# Patient Record
Sex: Male | Born: 1940 | Race: White | Hispanic: No | Marital: Married | Smoking: Never smoker
Health system: Southern US, Community
[De-identification: ages and names within clinical notes are randomized; demographics above are authoritative.]

## PROBLEM LIST (undated history)

## (undated) DIAGNOSIS — E7800 Pure hypercholesterolemia, unspecified: Secondary | ICD-10-CM

## (undated) DIAGNOSIS — K469 Unspecified abdominal hernia without obstruction or gangrene: Secondary | ICD-10-CM

## (undated) DIAGNOSIS — M1990 Unspecified osteoarthritis, unspecified site: Secondary | ICD-10-CM

## (undated) DIAGNOSIS — K859 Acute pancreatitis without necrosis or infection, unspecified: Secondary | ICD-10-CM

## (undated) DIAGNOSIS — I10 Essential (primary) hypertension: Secondary | ICD-10-CM

## (undated) DIAGNOSIS — Z8719 Personal history of other diseases of the digestive system: Secondary | ICD-10-CM

## (undated) DIAGNOSIS — K5790 Diverticulosis of intestine, part unspecified, without perforation or abscess without bleeding: Secondary | ICD-10-CM

## (undated) HISTORY — DX: Diverticulosis of intestine, part unspecified, without perforation or abscess without bleeding: K57.90

## (undated) HISTORY — PX: ROTATOR CUFF REPAIR: SHX139

---

## 2001-12-03 ENCOUNTER — Other Ambulatory Visit: Admission: RE | Admit: 2001-12-03 | Discharge: 2001-12-03 | Payer: Self-pay | Admitting: Dermatology

## 2003-09-05 ENCOUNTER — Ambulatory Visit (HOSPITAL_COMMUNITY): Admission: RE | Admit: 2003-09-05 | Discharge: 2003-09-05 | Payer: Self-pay | Admitting: Internal Medicine

## 2007-12-28 ENCOUNTER — Observation Stay (HOSPITAL_COMMUNITY): Admission: EM | Admit: 2007-12-28 | Discharge: 2007-12-29 | Payer: Self-pay | Admitting: Emergency Medicine

## 2008-02-14 ENCOUNTER — Ambulatory Visit (HOSPITAL_COMMUNITY): Admission: RE | Admit: 2008-02-14 | Discharge: 2008-02-14 | Payer: Self-pay | Admitting: Sports Medicine

## 2008-03-06 ENCOUNTER — Ambulatory Visit (HOSPITAL_BASED_OUTPATIENT_CLINIC_OR_DEPARTMENT_OTHER): Admission: RE | Admit: 2008-03-06 | Discharge: 2008-03-07 | Payer: Self-pay | Admitting: Orthopedic Surgery

## 2008-03-17 ENCOUNTER — Encounter (HOSPITAL_COMMUNITY): Admission: RE | Admit: 2008-03-17 | Discharge: 2008-04-16 | Payer: Self-pay | Admitting: Orthopedic Surgery

## 2008-04-18 ENCOUNTER — Encounter (HOSPITAL_COMMUNITY): Admission: RE | Admit: 2008-04-18 | Discharge: 2008-05-18 | Payer: Self-pay | Admitting: Orthopedic Surgery

## 2008-05-20 ENCOUNTER — Encounter (HOSPITAL_COMMUNITY): Admission: RE | Admit: 2008-05-20 | Discharge: 2008-06-19 | Payer: Self-pay | Admitting: Orthopedic Surgery

## 2008-07-08 ENCOUNTER — Encounter (HOSPITAL_COMMUNITY): Admission: RE | Admit: 2008-07-08 | Discharge: 2008-08-07 | Payer: Self-pay | Admitting: Orthopedic Surgery

## 2008-08-08 ENCOUNTER — Encounter (HOSPITAL_COMMUNITY): Admission: RE | Admit: 2008-08-08 | Discharge: 2008-08-15 | Payer: Self-pay | Admitting: Orthopedic Surgery

## 2008-12-26 HISTORY — PX: MELANOMA EXCISION: SHX5266

## 2009-02-05 ENCOUNTER — Emergency Department (HOSPITAL_COMMUNITY): Admission: EM | Admit: 2009-02-05 | Discharge: 2009-02-05 | Payer: Self-pay | Admitting: Emergency Medicine

## 2009-11-10 ENCOUNTER — Ambulatory Visit (HOSPITAL_COMMUNITY): Admission: RE | Admit: 2009-11-10 | Discharge: 2009-11-10 | Payer: Self-pay | Admitting: Sports Medicine

## 2009-12-10 ENCOUNTER — Encounter (HOSPITAL_COMMUNITY): Admission: RE | Admit: 2009-12-10 | Discharge: 2009-12-23 | Payer: Self-pay | Admitting: Orthopedic Surgery

## 2009-12-28 ENCOUNTER — Encounter (HOSPITAL_COMMUNITY): Admission: RE | Admit: 2009-12-28 | Discharge: 2010-01-27 | Payer: Self-pay | Admitting: Orthopedic Surgery

## 2010-01-29 ENCOUNTER — Encounter (HOSPITAL_COMMUNITY): Admission: RE | Admit: 2010-01-29 | Discharge: 2010-02-28 | Payer: Self-pay | Admitting: Orthopedic Surgery

## 2010-03-01 ENCOUNTER — Encounter (HOSPITAL_COMMUNITY): Admission: RE | Admit: 2010-03-01 | Discharge: 2010-03-31 | Payer: Self-pay | Admitting: Orthopedic Surgery

## 2011-05-10 NOTE — Op Note (Signed)
NAMEVANDELL, Russell Bryant              ACCOUNT NO.:  [id number]   MEDICAL RECORD NO.:  [id number]          PATIENT TYPE:  AMB   LOCATION:  DSC                          FACILITY:  [facility]   PHYSICIAN:  Loreta Ave, M.D. 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   PROCEDURES:  1. Left shoulder examination under anesthesia.  2. Arthroscopy with manipulation.  3. Debridement of adhesions and rotator cuff.  4. Acromioplasty, coracoacromial ligament release, excision of distal      clavicle.  5. Arthroscopic-assisted rotator cuff repair with fiber weave suture      x2 and swivel lock anchors x2.   SURGEON:  Loreta Ave, M.D.   ASSISTANT:  Zonia Kief, PA, present throughout the entire case and  necessary for timely completion of the procedure.   ANESTHESIA:  General.   BLOOD LOSS:  Minimal.   SPECIMENS:  None.   CULTURES:  None.   COMPLICATIONS:  None.   DRESSING:  Soft compressive with shoulder immobilizer.   PROCEDURE:  The patient brought to the operating room and after adequate  anesthesia had been obtained, left shoulder examined.  Lacking about 25-  30% of motion in all planes, especially going overhead and with  rotation.  Manipulated, breaking up adhesions, achieving full motion,  maintaining a stable shoulder.  Placed in beach-chair position on a  shoulder positioner and prepped and draped in the usual sterile fashion.  Three portals, anterior, posterior and lateral.  Shoulder entered with  blunt obturator.  Arthroscope introduced, shoulder distended and  inspected.  Residual adhesions and synovitis debrided.  Articular  cartilage of  labrum intact.  Partial tearing, long head biceps tendon,  with about the 30% compromise but still attached to the top of the  glenoid and functional and intact.  Complete tear, entire supraspinatus  tendon, from the biceps all way into the top half of the infraspinatus.  Appeared to be relatively acute.  Although a complete tear, still very  mobile and reparable.  Debrided.  Tuberosity roughened to bleeding bone.  Cannula redirected subacromially.  Bursitis adhesions debrided.  Cuff  debrided.  Acromioplasty from a type 3 to a type 1 acromion with shaver  and high-speed bur, releasing CA ligament with cautery.  Distal clavicle  with grade 3 and 4 changes.  Periarticular spurs and the lateral  centimeter of clavicle resected.  Adequacy of decompression and clavicle  excision confirmed viewing from all portals.  Lateral portal opened up  and a cannula inserted.  Cuff mobilized.  It was captured with two  horizontal mattress fiber weave sutures with the suture brought out the  front.  I then brought the front one out through the side, attached to a  swivel lock device.  Anchored down to the tuberosity in a drill hole  with the  swivel lock device with appropriate tension.  Nice capturing  and fixation.  I then repeated the same process with the more posterior  suture, anchoring that a little bit more posteriorly.  Once those were  firmly anchored and the sutures cut, the repair was assessed.  I had a  nice, firm, watertight closure of the entire cuff.  The back stitch  included the top half of the infraspinatus.  I was pleased with  tensioning and repair and not undue tension even with the arm at the  side.  Instruments and fluid removed.  Everything irrigated.  Portals  were all closed with nylon.  Sterile compressive dressing applied.  Shoulder mobilizer applied.  Anesthesia reversed.  Brought to the  recovery room.  Tolerated the surgery well with no complications.      Loreta Ave, M.D.  Electronically Signed     DFM/MEDQ  D:  03/06/2008  T:  03/07/2008  Job:  [id number]

## 2011-05-10 NOTE — H&P (Signed)
NAMEEUAN, Russell Bryant              ACCOUNT NO.:  [id number]   MEDICAL RECORD NO.:  [id number]          PATIENT TYPE:  EMS   LOCATION:  ED                            FACILITY:  [facility]   PHYSICIAN:  Osvaldo Shipper, MD     DATE OF BIRTH:  Oct 21, 1941   DATE OF ADMISSION:  12/28/2007  DATE OF DISCHARGE:  LH                              HISTORY & PHYSICAL   PRIORITY ADMISSION HISTORY AND PHYSICAL   PRIMARY CARE PHYSICIAN:  Kirk Ruths, MD, with [facility].   ADMITTING DIAGNOSES:  1. Syncopal episode unclear etiology.  2. History of hypertension.  3. History of dyslipidemia.   CHIEF COMPLAINT:  Passed out.   HISTORY OF PRESENT ILLNESS:  The patient is a 70 year old, Caucasian  male, who has hypertension and dyslipidemia, who otherwise is very  healthy, who was standing in a line at [employer] waiting to pay  his bill when he felt hot and slightly flushed and then apparently  passed out.  He was unconscious for about a minute.  EMS was called and  he was brought into the ED.  Apparently his blood sugar was checked at  that time and it was 121.  The patient did not experience any chest pain  or shortness of breath.  He was a little bit nauseous but did not have  any vomiting.  No history of fever or chills though he was down with  what he describes as sinus congestion for the past few days and he  finished a course of Amoxicillin yesterday.  Denies any headaches.  He  does now admit to having some pain in the left arm.  He says that  movement of the left arm causes the pain to increase.  He does not know  if he fell on that side or not.   He admits to having one other episode of syncope about a year ago.  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  The  circumstances were exactly the same as this time.  The last time, his  fall was prevented by his wife, who was standing behind him.  He did not  have any further evaluation for  his syncope at that time.   MEDICATIONS AT HOME:  1. Aspirin 81 mg daily.  2. Centrum Silver one tablet daily.  3. Welchol unknown dose.  4. Lotrel 10/20 one tablet daily.   ALLERGIES:  No known drug allergies.   PAST MEDICAL HISTORY:  1. Hypertension.  2. Dyslipidemia.  3. 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  4. Reports having cramps in his legs and his abdomen at times and did      have one episode today after the fall, but he denies any abdominal      pain at this time.  5. He had  a stress test about three years ago done by [facility] and was recorded as being negative.   SOCIAL HISTORY:  He lives in [place] with his wife.  He currently  works part-time in a [employer] center.  He is a retired person and  used to work at [employer].  Denies any smoking, no  alcohol or illicit drug use.  He walks two miles every day.   FAMILY HISTORY:  Father died of an MI many years ago.  Mother died at  the age of 83 of unknown causes.   REVIEW OF SYSTEMS:  GENERAL REVIEW OF SYSTEMS:  Positive for mild  weakness.  HEENT:  Unremarkable.  CARDIOVASCULAR:  Unremarkable.  RESPIRATORY:  Unremarkable.  GI:  No pain at this time.  GU:  Unremarkable.  MUSCULOSKELETAL:  Positive for the left upper pain as  described in the HPI.  NEUROLOGICAL:  Unremarkable.  PSYCHIATRIC:  Unremarkable.  DERMATOLOGICAL:  Positive for skin cancers.  Other  systems unremarkable.   PHYSICAL EXAMINATION:  VITAL SIGNS:  Temperature 98.1, blood pressure  126/76, heart rate 72, respiratory rate 18, saturation 100% on room air.  GENERAL EXAM:  This is a well-developed, well-nourished male in no  distress, slightly anxious.  HEENT:  There is no pallor and no icterus, oral mucous membrane is  moist, no oral lesions are  noted.  NECK:  Soft and supple, no thyromegaly is appreciated.  LUNGS:  Clear to auscultation bilaterally, no wheezing, rales, or  rhonchi.  CARDIOVASCULAR:  S1 and S2 is normal, regular, no murmurs appreciated,  no rubs are heard, there is a bruit heard in the right carotid area,  peripheral pulses are palpable.  ABDOMEN:  Soft, nontender, and nondistended, bowel sounds are present,  no mass or organomegaly is appreciated.  EXTREMITIES:  No edema, peripheral pulses are palpable.  NEUROLOGIC:  The patient is alert and oriented x3, no cranial nerve  deficits, no motor deficit, no sensory deficit, no focal neurological  deficits at this time.   LABORATORY DATA:  CBC is unremarkable.  His BMET shows a sodium of 134,  glucose 132.   EKG was done, which shows a sinus rhythm, a heart rate of 65, a normal  axis, intervals appear to be in the normal range, no definite Q-waves  identified, I do not see any significant ST changes or T-wave changes on  this EKG, essentially an unremarkable EKG.   CT head was done, which was read as not showing any acute abnormality.   ASSESSMENT:  This is a 70 year old Caucasian male, who has a past  medical history of hypertension and dyslipidemia and one prior episode  of syncope, who presents with a syncopal episode.  Most likely vasovagal  though we have to rule out other concerning etiologies.  Arrythmia is a  possibility considering the sudden nature of the syncope.  Acute  coronary syndrome and strokes are a consideration as well.  No chest  pain; hence, pulmonary embolism as a less other possibility.   PLAN:  1. Syncopal episode.  We will check orthostatics, check D-dimer,      cardiac markers, chest x-ray, will monitor him on telemetry, and      rule him out for acute coronary syndrome.  Since tomorrow is      Saturday, an echocardiogram cannot be done.  If he rules out for      acute coronary syndrome and if his telemetry does not  show any       arrhythmias and if he does not have any more syncopal episodes, I      think he can be safely discharged and the echo can be done as an      outpatient.  2. Left arm pain.  It is probably muscular because the pain was      reproduced by palpation.  We will get x-rays to make sure there is      no trauma, and we will give him pain medication as needed.  3. Possible right carotid bruit.  We will obtain a carotid Doppler,      which hopefully can be done tomorrow.  If not, that can be done as      an outpatient as well.   Continue his Lotrel.  We will hold off on the [facility] since we do not  know the exact dose.  We will monitor him closely.   Further management decision will depend on results of initial testing  and patient's response to treatment.      Osvaldo Shipper, MD  Electronically Signed     GK/MEDQ  D:  12/28/2007  T:  12/28/2007  Job:  [id number]   cc:   Kirk Ruths, M.D.  Fax: [phone]

## 2011-05-10 NOTE — Discharge Summary (Signed)
Russell Bryant, Russell Bryant              ACCOUNT NO.:  [id number]   MEDICAL RECORD NO.:  [id number]          PATIENT TYPE:  OBV   LOCATION:  A209                          FACILITY:  [facility]   PHYSICIAN:  Osvaldo Shipper, MD     DATE OF BIRTH:  September 20, 1941   DATE OF ADMISSION:  12/28/2007  DATE OF DISCHARGE:  01/03/2009LH                               DISCHARGE SUMMARY   PRIMARY MEDICAL DOCTOR:  Dr. Yetta Numbers.   DISCHARGE DIAGNOSES:  1. Syncopal episode, likely vasovagal, need to rule out other      etiology.  2. History of hypertension and dyslipidemia.   HISTORY OF PRESENT ILLNESS:  Please see H&P dictated yesterday for  details regarding the patient's presenting illness.   BRIEF HOSPITAL COURSE:  Briefly, this is a 70 year old Caucasian male  who has hypertension and dyslipidemia, but who otherwise is fairly  healthy and very active, who was standing in the line at [employer] waiting to pay his bill when he felt hot and flushed and passed  out.  The episode lasted less than a minute and he woke up.  He did not  have any focal deficits.  No seizure-type activity was noticed.  The  patient was observed in the hospital overnight.  Apparently, he had one  other episode associated with nausea yesterday while he was waiting for  his shoulder x-ray in Radiology.  He had his dinner subsequently and  then felt much better.  No further episodes overnight.  This morning, he  is feeling completely normal.  He still has some pain in the left upper  arm area, but he is able to move it and he says he can tolerate the  pain.  His telemetry was monitored and it showed sinus rhythm with no  tachyarrhythmias.  No bradycardia was noted.  His EKG was repeated and  there were no significant abnormalities.  The patient's CAT scan was  negative.  His shoulder and humerus films were unremarkable.  I do not  suspect any humeral fracture at this time.  He has ruled out for acute  coronary syndrome.  UA  was negative.  His chest x-ray was read as being  normal as well.  He did have a right carotid bruit for which a carotid  Doppler is pending.  He will also need to have an echocardiogram and  preferably a Holter monitor, which will be set up as an outpatient.  Orthostatics will be checked this morning and once the Doppler is done,  he can go home.  I have spoken to Dr. Regino Schultze, who has requested me to  ask the patient to call his office on Monday to schedule an appointment.  He will be asked not to drive for 3 weeks until all the workup is  completed for his episode.   DISCHARGE MEDICATIONS:  At this point, he may continue taking all of his  home medications including:  1. Welchol, unknown dose.  2. Lotrel 10/20 once daily.  3. Aspirin 81 mg daily.  4. Centrum Silver one tablet a day.  5. He has been  asked to take over-the-counter Tylenol for pain      associated with his left upper arm.   FOLLOWUP:  1. Follow up with Dr. Regino Schultze early next week.  2. He will be given prescriptions for an echocardiogram and Holter      monitor which he can take to [facility] office and set      up an appointment for these 2.  Carotid Dopplers hopefully will be      done today.  I do not expect the result will change his      disposition, but if it does, we will dictate an addendum to this      discharge summary.   All of his questions and his wife's questions were answered to their  satisfaction and I have once again emphasized the importance of followup  with his PMD.   COMMENT:  Please note above is preliminary until signed.   DISCHARGE PROCESS:  Total time at discharge was less than 30 minutes.      Osvaldo Shipper, MD  Electronically Signed     GK/MEDQ  D:  12/29/2007  T:  12/29/2007  Job:  [id number]   cc:   Kirk Ruths, M.D.  Fax: [phone]

## 2011-08-03 ENCOUNTER — Emergency Department (HOSPITAL_COMMUNITY): Payer: Medicare Other

## 2011-08-03 ENCOUNTER — Emergency Department (HOSPITAL_COMMUNITY)
Admission: EM | Admit: 2011-08-03 | Discharge: 2011-08-03 | Disposition: A | Discharge status: Home / Self Care | Payer: Medicare Other | Attending: Emergency Medicine | Admitting: Emergency Medicine

## 2011-08-03 ENCOUNTER — Other Ambulatory Visit: Payer: Self-pay

## 2011-08-03 DIAGNOSIS — R109 Unspecified abdominal pain: Secondary | ICD-10-CM

## 2011-08-03 DIAGNOSIS — R1013 Epigastric pain: Secondary | ICD-10-CM | POA: Insufficient documentation

## 2011-08-03 HISTORY — DX: Acute pancreatitis without necrosis or infection, unspecified: K85.90

## 2011-08-03 HISTORY — DX: Unspecified abdominal hernia without obstruction or gangrene: K46.9

## 2011-08-03 LAB — CARDIAC PANEL(CRET KIN+CKTOT+MB+TROPI)
CK, MB: 4.1 ng/mL — ABNORMAL HIGH (ref 0.3–4.0)
CK, MB: 4 ng/mL (ref 0.3–4.0)
Relative Index: 2.1 (ref 0.0–2.5)
Relative Index: 2.2 (ref 0.0–2.5)
Total CK: 193 U/L (ref 7–232)
Total CK: 183 U/L (ref 7–232)
Troponin I: <0.3 ng/mL (ref <0.30)
Troponin I: <0.3 ng/mL (ref <0.30)

## 2011-08-03 LAB — DIFFERENTIAL
Basophils Relative: 0 % (ref 0–1)
Lymphs Abs: 2.5 10*3/uL (ref 0.7–4.0)
Monocytes Relative: 9 % (ref 3–12)
Neutro Abs: 3.2 10*3/uL (ref 1.7–7.7)
Neutrophils Relative %: 49 % (ref 43–77)

## 2011-08-03 LAB — COMPREHENSIVE METABOLIC PANEL
Albumin: 3.9 g/dL (ref 3.5–5.2)
Alkaline Phosphatase: 58 U/L (ref 39–117)
BUN: 13 mg/dL (ref 6–23)
Calcium: 9.3 mg/dL (ref 8.4–10.5)
GFR calc Af Amer: >60 mL/min (ref >60)
Glucose, Bld: 110 mg/dL — ABNORMAL HIGH (ref 70–99)
Potassium: 4 mEq/L (ref 3.5–5.1)
Sodium: 139 mEq/L (ref 135–145)
Total Protein: 7.6 g/dL (ref 6.0–8.3)

## 2011-08-03 LAB — URINALYSIS, ROUTINE W REFLEX MICROSCOPIC
Bilirubin Urine: NEGATIVE
Glucose, UA: NEGATIVE mg/dL
Ketones, ur: NEGATIVE mg/dL
Leukocytes, UA: NEGATIVE
Nitrite: NEGATIVE
Protein, ur: NEGATIVE mg/dL
Specific Gravity, Urine: >1.03 — ABNORMAL HIGH (ref 1.005–1.030)
Urobilinogen, UA: 0.2 mg/dL (ref 0.0–1.0)
pH: 5.5 (ref 5.0–8.0)

## 2011-08-03 LAB — URINE MICROSCOPIC-ADD ON

## 2011-08-03 LAB — CBC
Hemoglobin: 14.9 g/dL (ref 13.0–17.0)
Platelets: 223 10*3/uL (ref 150–400)
RBC: 4.7 MIL/uL (ref 4.22–5.81)

## 2011-08-03 LAB — LIPASE, BLOOD: Lipase: 54 U/L (ref 11–59)

## 2011-08-03 MED ORDER — FENTANYL CITRATE 0.05 MG/ML IJ SOLN
50.0000 ug | Freq: Once | INTRAMUSCULAR | Status: DC
  Filled 2011-08-03: qty 2

## 2011-08-03 MED ORDER — FAMOTIDINE 20 MG PO TABS
10.0000 mg | ORAL_TABLET | Freq: Once | ORAL | Status: AC
  Administered 2011-08-03: 20 mg via ORAL
  Filled 2011-08-03: qty 1

## 2011-08-03 NOTE — ED Notes (Signed)
Pt indicates epigastric pain.

## 2011-08-03 NOTE — Discharge Instructions (Signed)
Abdominal Pain Abdominal pain can be caused by many things. Your caregiver decides the seriousness of your pain by an examination and possibly blood tests and X-rays. Many cases can be observed and treated at home. Most abdominal pain is not caused by a disease and will probably improve without treatment. However, in many cases, more time must pass before a clear cause of the pain can be found. Before that point, it may not be known if you need more testing, or if hospitalization or surgery is needed. HOME CARE INSTRUCTIONS  Do not take laxatives unless directed by your caregiver.   Take pain medicine only as directed by your caregiver.   Only take over-the-counter or prescription medicines for pain, discomfort, or fever as directed by your caregiver.   Try a clear liquid diet (broth, tea, or water)  or as ordered by your caregiver. Slowly move to a bland diet as tolerated.  SEEK IMMEDIATE MEDICAL CARE IF:  The pain does not go away.   You or your child has an oral temperature above 101, not controlled by medicine.   You keep throwing up (vomiting).   The pain is felt only in portions of the abdomen. Pain in the right side could possibly be appendicitis. In an adult, pain in the left lower portion of the abdomen could be colitis or diverticulitis.   You pass bloody or black tarry stools.  MAKE SURE YOU:  Understand these instructions.   Will watch your condition.   Will get help right away if you are not doing well or get worse.  Document Released: 09/21/2005 Document Re-Released: 03/08/2010 [facility] Patient Information 2011 [place], [place].

## 2011-08-03 NOTE — ED Provider Notes (Addendum)
Scribed for Dr. Hillery Hunter, the patient was seen in room 10. This chart was scribed by [employer]. This patient's care was started at 5:22PM.    History     CSN: [id number] Arrival date & time: 08/03/2011  5:05 PM  Chief Complaint  Patient presents with  . Abdominal Pain   Patient is a 70 y.o. male presenting with abdominal pain. The history is provided by the patient.  Abdominal Pain The primary symptoms of the illness include abdominal pain. The primary symptoms of the illness do not include shortness of breath, nausea, vomiting, diarrhea or dysuria.  Symptoms associated with the illness do not include chills, diaphoresis, constipation, hematuria or back pain.   Patient presents to the ED with complaint of dull and constant abdominal pain in the past hour. Onset occuring while sitting. He reports that pain is different from that of pancreatis formerly experienced. Patient localizes pain an inch below the ribcage, proximal to the umbilicus, and reports that it does not radiate to the chest. He states that he had eaten a candy bar before the pain started. Pain was alleviated by Prolisec and "a couple of tums". He ranks pain upon onset at 8/10 but places it at 4/10 currently. He denies nausea, vomiting, diarrhea, sweating, dysuria, blood in stool, constipation, shortness of breath, bruising or bleeding, light headedness, dizziness, fever or chills. Patient reports last normal bowel movement was this morning. Patient denies history of abdominal surgery.    Past Medical History  Diagnosis Date  . Pancreatitis   . Hernia     History reviewed. No pertinent past surgical history.  History reviewed. No pertinent family history.  History  Substance Use Topics  . Smoking status: Never Smoker   . Smokeless tobacco: Not on file  . Alcohol Use: No      Review of Systems  Constitutional: Negative for chills and diaphoresis.  HENT: Negative for neck pain.   Eyes: Negative for  photophobia and visual disturbance.  Respiratory: Negative for cough and shortness of breath.   Cardiovascular: Negative for chest pain.  Gastrointestinal: Positive for abdominal pain. Negative for nausea, vomiting, diarrhea, constipation and blood in stool.  Genitourinary: Negative for dysuria and hematuria.  Musculoskeletal: Negative for back pain.  Neurological: Negative for dizziness, syncope, weakness, light-headedness, numbness and headaches.  Psychiatric/Behavioral: Negative for behavioral problems, confusion and agitation.  All other systems reviewed and are negative.    Physical Exam  BP 130/71  Pulse 66  Temp(Src) 97.6 F (36.4 C) (Oral)  Resp 20  Ht 5\' 8"  (1.727 m)  Wt 170 lb (77.111 kg)  BMI 25.85 kg/m2  SpO2 96%  Physical Exam  Nursing note and vitals reviewed. Constitutional: He is oriented to person, place, and time. He appears well-nourished. No distress.  HENT:  Head: Atraumatic.  Eyes: Conjunctivae and EOM are normal. Pupils are equal, round, and reactive to light.  Neck: Normal range of motion. Neck supple. No JVD present. No tracheal tenderness present. Carotid bruit is not present. No thyromegaly present.  Cardiovascular: Normal rate, regular rhythm, S1 normal, S2 normal and normal heart sounds.   Pulmonary/Chest: Effort normal. No stridor. No respiratory distress. He has no wheezes.       CTAB  Abdominal: Soft. Bowel sounds are normal. He exhibits no distension. There is no tenderness. There is no rebound and no guarding.       Tenderness just above umbilicus  Musculoskeletal: Normal range of motion.  Neurological: He is alert and oriented to person,  place, and time. He displays normal reflexes. GCS eye subscore is 4. GCS verbal subscore is 5. GCS motor subscore is 6.       5/5 strength in all extremities  Skin: Skin is warm and dry.  Psychiatric: He has a normal mood and affect. His behavior is normal.    ED Course  Procedures  OTHER DATA  REVIEWED: Nursing notes, vital signs, and past medical records reviewed. All labs/vitals reviewed and considered Imaging results reviewed and considered  DIAGNOSTIC STUDIES: Oxygen Saturation is 98% on room air, normal by my interpretation.     Date: 08/03/2011  Rate: 73  Rhythm: normal sinus rhythm  QRS Axis: normal  Intervals: normal  ST/T Wave abnormalities: normal  Conduction Disutrbances:none  Narrative Interpretation: Possible left atrial enlargement     LABS / RADIOLOGY: Results for orders placed during the hospital encounter of 08/03/11  CBC      Component Value Range   WBC 6.5  4.0 - 10.5 (K/uL)   RBC 4.70  4.22 - 5.81 (MIL/uL)   Hemoglobin 14.9  13.0 - 17.0 (g/dL)   HCT 16.1  09.6 - 04.5 (%)   MCV 90.4  78.0 - 100.0 (fL)   MCH 31.7  26.0 - 34.0 (pg)   MCHC 35.1  30.0 - 36.0 (g/dL)   RDW 40.9  81.1 - 91.4 (%)   Platelets 223  150 - 400 (K/uL)  DIFFERENTIAL      Component Value Range   Neutrophils Relative 49  43 - 77 (%)   Neutro Abs 3.2  1.7 - 7.7 (K/uL)   Lymphocytes Relative 38  12 - 46 (%)   Lymphs Abs 2.5  0.7 - 4.0 (K/uL)   Monocytes Relative 9  3 - 12 (%)   Monocytes Absolute 0.6  0.1 - 1.0 (K/uL)   Eosinophils Relative 3  0 - 5 (%)   Eosinophils Absolute 0.2  0.0 - 0.7 (K/uL)   Basophils Relative 0  0 - 1 (%)   Basophils Absolute 0.0  0.0 - 0.1 (K/uL)  CARDIAC PANEL(CRET KIN+CKTOT+MB+TROPI)      Component Value Range   Total CK 193  7 - 232 (U/L)   CK, MB 4.1 (*) 0.3 - 4.0 (ng/mL)   Troponin I <0.30  <0.30 (ng/mL)   Relative Index 2.1  0.0 - 2.5   COMPREHENSIVE METABOLIC PANEL      Component Value Range   Sodium 139  135 - 145 (mEq/L)   Potassium 4.0  3.5 - 5.1 (mEq/L)   Chloride 103  96 - 112 (mEq/L)   CO2 23  19 - 32 (mEq/L)   Glucose, Bld 110 (*) 70 - 99 (mg/dL)   BUN 13  6 - 23 (mg/dL)   Creatinine, Ser 7.82  0.50 - 1.35 (mg/dL)   Calcium 9.3  8.4 - 95.6 (mg/dL)   Total Protein 7.6  6.0 - 8.3 (g/dL)   Albumin 3.9  3.5 - 5.2 (g/dL)    AST 62 (*) 0 - 37 (U/L)   ALT 43  0 - 53 (U/L)   Alkaline Phosphatase 58  39 - 117 (U/L)   Total Bilirubin 1.2  0.3 - 1.2 (mg/dL)   GFR calc non Af Amer >60  >60 (mL/min)   GFR calc Af Amer >60  >60 (mL/min)  LIPASE, BLOOD      Component Value Range   Lipase 54  11 - 59 (U/L)  URINALYSIS, ROUTINE W REFLEX MICROSCOPIC      Component Value  Range   Color, Urine YELLOW  YELLOW    Appearance CLEAR  CLEAR    Specific Gravity, Urine >1.030 (*) 1.005 - 1.030    pH 5.5  5.0 - 8.0    Glucose, UA NEGATIVE  NEGATIVE (mg/dL)   Hgb urine dipstick TRACE (*) NEGATIVE    Bilirubin Urine NEGATIVE  NEGATIVE    Ketones, ur NEGATIVE  NEGATIVE (mg/dL)   Protein, ur NEGATIVE  NEGATIVE (mg/dL)   Urobilinogen, UA 0.2  0.0 - 1.0 (mg/dL)   Nitrite NEGATIVE  NEGATIVE    Leukocytes, UA NEGATIVE  NEGATIVE   URINE MICROSCOPIC-ADD ON      Component Value Range   Squamous Epithelial / LPF RARE  RARE    WBC, UA 0-2  <3 (WBC/hpf)   RBC / HPF 0-2  <3 (RBC/hpf)   Bacteria, UA RARE  RARE    Urine-Other MUCOUS PRESENT     Dg Abd Acute W/chest  08/03/2011  *RADIOLOGY REPORT*  Clinical Data: Epigastric pain, hiatal hernia, pancreatitis  ACUTE ABDOMEN SERIES (ABDOMEN 2 VIEW & CHEST 1 VIEW)  Comparison: None.  Findings: Lungs are clear. No pleural effusion or pneumothorax.  Cardiomediastinal silhouette is within normal limits.  Nonobstructive bowel gas pattern.  No evidence of free air under the diaphragm on the upright view.  Degenerative changes of the visualized thoracolumbar spine.  IMPRESSION: No evidence of acute cardiopulmonary disease.  No evidence of small bowel obstruction or free air.  Original Report Authenticated By: Charline Bills, M.D.    ED COURSE / COORDINATION OF CARE: Orders Placed This Encounter  Procedures  . Urine culture  . DG Abd Acute W/Chest  . CBC  . Differential  . Cardiac panel(cret kin+cktot+mb+tropi)  . Comprehensive metabolic panel  . Lipase, blood  . Urinalysis, Routine w reflex  microscopic  . Urine microscopic-add on  . Cardiac panel Timed (cret kin+cktot+mb+tropi)   19:47 [place] Fritze K Keiran Gaffey-Rasch, MD rechecked with patient and discussed returned lab work and results. Patient condition is improved and pain is currently 0/10 on the pain scale. Possible discharge protocol discussed.    IMPRESSION: Diagnoses that have been ruled out:  Diagnoses that are still under consideration:  Final diagnoses:      PLAN:  Discharge The patient is to return the emergency department if there is any worsening of symptoms. I have reviewed the discharge instructions with the patient and family  CONDITION ON DISCHARGE:good   MEDICATIONS GIVEN IN THE E.D.  Medications  amLODipine-benazepril (LOTREL) 10-20 MG per capsule (not administered)  aspirin EC 81 MG tablet (not administered)  Multiple Vitamins-Minerals (CENTRUM SILVER ULTRA MENS PO) (not administered)  colesevelam (WELCHOL) 625 MG tablet (not administered)  simvastatin (ZOCOR) 10 MG tablet (not administered)  PRESCRIPTION MEDICATION (not administered)  fentaNYL (SUBLIMAZE) injection 50 mcg (50 mcg Intravenous Not Given 08/03/11 1837)     DISCHARGE MEDICATIONS: New Prescriptions   No medications on file      Return for return of pain, chest pain,SOB, DOE,  nausea vomiting or pain in the abdomen that localizes to the RLQ or any concerns.  Follow up with family doctor for recheck tomorrow. Patient verbalizes understanding and agrees to follow up   I personally performed the services described in this documentation, which was scribed in my presence. The recorded information has been reviewed and considered. Colt Martelle K Geneva Pallas-Rasch, MD   [place] Urieta K Martita Brumm-Rasch, MD 08/03/11 2100  Jasmine Awe, MD 08/03/11 2108

## 2011-08-03 NOTE — ED Notes (Signed)
Pt self ambulated out with a steady gait stating no needs 

## 2011-08-03 NOTE — ED Notes (Signed)
Pt refused fentanyl pain med stating, " The pain is gone and I don't think I need it."  nad noted.

## 2011-08-05 LAB — URINE CULTURE: Culture  Setup Time: 201208090128

## 2011-09-15 LAB — URINALYSIS, ROUTINE W REFLEX MICROSCOPIC
Bilirubin Urine: NEGATIVE
Glucose, UA: 100 — AB
Hgb urine dipstick: NEGATIVE
Ketones, ur: NEGATIVE
Protein, ur: NEGATIVE

## 2011-09-15 LAB — DIFFERENTIAL
Basophils Relative: 1
Eosinophils Absolute: 0.1
Monocytes Relative: 7
Neutrophils Relative %: 57

## 2011-09-15 LAB — LIPID PANEL
Cholesterol: 204 — ABNORMAL HIGH
LDL Cholesterol: 140 — ABNORMAL HIGH
Triglycerides: 110

## 2011-09-15 LAB — BASIC METABOLIC PANEL
CO2: 27
Calcium: 9.2
Chloride: 100
Creatinine, Ser: 1.15
GFR calc Af Amer: >60
Glucose, Bld: 132 — ABNORMAL HIGH

## 2011-09-15 LAB — CBC
MCHC: 34.7
MCV: 88.6
Platelets: 282

## 2011-09-15 LAB — CARDIAC PANEL(CRET KIN+CKTOT+MB+TROPI)
Relative Index: 1.3
Total CK: 157
Troponin I: 0.02

## 2011-09-15 LAB — POCT CARDIAC MARKERS
Myoglobin, poc: 119
Myoglobin, poc: 155
Troponin i, poc: <0.05

## 2011-09-15 LAB — TSH: TSH: 3.375

## 2011-09-19 LAB — BASIC METABOLIC PANEL
BUN: 12
CO2: 28
Chloride: 102
Creatinine, Ser: 0.99
Glucose, Bld: 81
Potassium: 4.8

## 2012-02-11 ENCOUNTER — Emergency Department (HOSPITAL_COMMUNITY)
Admission: EM | Admit: 2012-02-11 | Discharge: 2012-02-12 | Disposition: A | Discharge status: Home / Self Care | Payer: Medicare Other | Attending: Emergency Medicine | Admitting: Emergency Medicine

## 2012-02-11 ENCOUNTER — Encounter (HOSPITAL_COMMUNITY): Payer: Self-pay | Admitting: *Deleted

## 2012-02-11 ENCOUNTER — Emergency Department (HOSPITAL_COMMUNITY): Payer: Medicare Other

## 2012-02-11 ENCOUNTER — Other Ambulatory Visit: Payer: Self-pay

## 2012-02-11 DIAGNOSIS — R110 Nausea: Secondary | ICD-10-CM | POA: Insufficient documentation

## 2012-02-11 DIAGNOSIS — R1013 Epigastric pain: Secondary | ICD-10-CM

## 2012-02-11 DIAGNOSIS — R10816 Epigastric abdominal tenderness: Secondary | ICD-10-CM | POA: Insufficient documentation

## 2012-02-11 LAB — DIFFERENTIAL
Basophils Absolute: 0 10*3/uL (ref 0.0–0.1)
Basophils Relative: 0 % (ref 0–1)
Eosinophils Relative: 2 % (ref 0–5)
Monocytes Absolute: 0.8 10*3/uL (ref 0.1–1.0)
Monocytes Relative: 10 % (ref 3–12)

## 2012-02-11 LAB — LIPASE, BLOOD: Lipase: 59 U/L (ref 11–59)

## 2012-02-11 LAB — COMPREHENSIVE METABOLIC PANEL
AST: 55 U/L — ABNORMAL HIGH (ref 0–37)
Albumin: 4.2 g/dL (ref 3.5–5.2)
BUN: 18 mg/dL (ref 6–23)
CO2: 29 mEq/L (ref 19–32)
Calcium: 9.5 mg/dL (ref 8.4–10.5)
Chloride: 100 mEq/L (ref 96–112)
Creatinine, Ser: 1.08 mg/dL (ref 0.50–1.35)
GFR calc non Af Amer: 67 mL/min — ABNORMAL LOW (ref >90)
Total Bilirubin: 0.9 mg/dL (ref 0.3–1.2)

## 2012-02-11 LAB — CBC
HCT: 43.2 % (ref 39.0–52.0)
Hemoglobin: 14.9 g/dL (ref 13.0–17.0)
MCHC: 34.5 g/dL (ref 30.0–36.0)
MCV: 91.5 fL (ref 78.0–100.0)
RDW: 12.7 % (ref 11.5–15.5)

## 2012-02-11 MED ORDER — FAMOTIDINE 20 MG PO TABS
20.0000 mg | ORAL_TABLET | Freq: Once | ORAL | Status: AC
  Administered 2012-02-11: 20 mg via ORAL
  Filled 2012-02-11: qty 1

## 2012-02-11 MED ORDER — MORPHINE SULFATE 4 MG/ML IJ SOLN
4.0000 mg | Freq: Once | INTRAMUSCULAR | Status: AC
  Administered 2012-02-11: 4 mg via INTRAVENOUS
  Filled 2012-02-11: qty 1

## 2012-02-11 MED ORDER — ONDANSETRON HCL 4 MG/2ML IJ SOLN
4.0000 mg | Freq: Once | INTRAMUSCULAR | Status: AC
  Administered 2012-02-11: 4 mg via INTRAVENOUS
  Filled 2012-02-11: qty 2

## 2012-02-11 NOTE — ED Notes (Signed)
Pt c/o upper abdominal pain and heaving x 1 hr.

## 2012-02-11 NOTE — ED Notes (Signed)
Medicated as ordered for 9\10 pain. Bed in low position and locked with side rails up. Call bell within reach. Family at bedside.

## 2012-02-11 NOTE — ED Provider Notes (Signed)
Pt seen with PA Idol He is here with abd pain, it is now resolved He denies cp/sob His abd is soft without focal tenderness Pt is requesting d/c home BP 148/72  Pulse 76  Temp 97.2 F (36.2 C)  Resp 20  Ht 5\' 8"  (1.727 m)  Wt 172 lb (78.019 kg)  BMI 26.15 kg/m2  SpO2 100%  Date: 02/11/2012  Rate: 80  Rhythm: normal sinus rhythm  QRS Axis: normal  Intervals: normal  ST/T Wave abnormalities: normal  Conduction Disutrbances:none    Joya Gaskins, MD 02/11/12 2358

## 2012-02-11 NOTE — ED Notes (Signed)
Into room to assess patient. States she started having middle upper abdominal pain in between right and left upper quadrant x 1 hour ago. States is just feels like a lot of pressure\ "gas bubble". Nothing makes the pain better or worse. Last food\ fluid intake was at 1700 this evening and tolerated well. Last bowel movement was this morning and was normal. Denies any vomiting or diarrhea. States he had one episode of heaving prior to arrival but did not vomit anything up. Active bowel sounds in all fields. Tender below epigastrium. Denies any radiation of pain. Getting into gown at this time. Denies needs. Call bell within reach. Awaiting md eval.

## 2012-02-11 NOTE — ED Notes (Signed)
Patient states he can not pass urine for specimen at this time.

## 2012-02-11 NOTE — ED Notes (Signed)
Normal saline started to push medications. Dialed back to [facility] after administration.

## 2012-02-11 NOTE — ED Notes (Signed)
Report given to Gail, RN

## 2012-02-11 NOTE — ED Notes (Signed)
Burgess Amor, PA at bedside to evaluate. Into room to attempt iv access.

## 2012-02-12 LAB — URINALYSIS, ROUTINE W REFLEX MICROSCOPIC
Glucose, UA: NEGATIVE mg/dL
Ketones, ur: NEGATIVE mg/dL
Leukocytes, UA: NEGATIVE
Nitrite: NEGATIVE
Urobilinogen, UA: 0.2 mg/dL (ref 0.0–1.0)

## 2012-02-12 LAB — URINE MICROSCOPIC-ADD ON

## 2012-02-12 NOTE — ED Provider Notes (Signed)
History     CSN: [id number]  Arrival date & time 02/11/12  2156   First MD Initiated Contact with Patient 02/11/12 2221      No chief complaint on file.   (Consider location/radiation/quality/duration/timing/severity/associated sxs/prior treatment) Patient is a 71 y.o. male presenting with abdominal pain. The history is provided by the patient.  Abdominal Pain The primary symptoms of the illness include abdominal pain and nausea. The primary symptoms of the illness do not include fever, shortness of breath or vomiting. The current episode started 1 to 2 hours ago. The onset of the illness was gradual.  Risk factors for an acute abdominal problem include being elderly. Symptoms associated with the illness do not include chills, anorexia, diaphoresis, constipation, hematuria or back pain. Associated symptoms comments: Nausea without emesis.  Reports trying to make himself vomit, in the hopes symptoms would improve, but was unable.. Significant associated medical issues do not include PUD, GERD, inflammatory bowel disease, liver disease or cardiac disease. Associated medical issues comments: Reports a history of pancreatitis several years ago.  He was seen bya GI specialist, who speculated he may have passed a gallstone as the trigger for this episode  as no other source could be found,  no symptoms since that time.  He does not drink alcohol..    Past Medical History  Diagnosis Date  . Pancreatitis   . Hernia     History reviewed. No pertinent past surgical history.  History reviewed. No pertinent family history.  History  Substance Use Topics  . Smoking status: Never Smoker   . Smokeless tobacco: Not on file  . Alcohol Use: No      Review of Systems  Constitutional: Negative for fever, chills and diaphoresis.  HENT: Negative for congestion, sore throat and neck pain.   Eyes: Negative.   Respiratory: Negative for chest tightness and shortness of breath.   Cardiovascular:  Negative for chest pain.  Gastrointestinal: Positive for nausea and abdominal pain. Negative for vomiting, constipation and anorexia.  Genitourinary: Negative.  Negative for hematuria.  Musculoskeletal: Negative for back pain, joint swelling and arthralgias.  Skin: Negative.  Negative for rash and wound.  Neurological: Negative for dizziness, weakness, light-headedness, numbness and headaches.  Hematological: Negative.   Psychiatric/Behavioral: Negative.     Allergies  Review of patient's allergies indicates no known allergies.  Home Medications   Current Outpatient Rx  Name Route Sig Dispense Refill  . AMLODIPINE BESY-BENAZEPRIL HCL 10-20 MG PO CAPS Oral Take 1 capsule by mouth daily.      . ASPIRIN EC 81 MG PO TBEC Oral Take 81 mg by mouth daily.      . CENTRUM SILVER ULTRA MENS PO Oral Take 1 tablet by mouth daily.      Marland Kitchen PRESCRIPTION MEDICATION Oral Take 1 tablet by mouth daily. PROSTATE MEDICATION     . SIMVASTATIN 10 MG PO TABS Oral Take 10 mg by mouth at bedtime.        BP 148/72  Pulse 76  Temp 97.2 F (36.2 C)  Resp 20  Ht 5\' 8"  (1.727 m)  Wt 172 lb (78.019 kg)  BMI 26.15 kg/m2  SpO2 100%  Physical Exam  Nursing note and vitals reviewed. Constitutional: He is oriented to person, place, and time. He appears well-developed and well-nourished.  HENT:  Head: Normocephalic and atraumatic.  Eyes: Conjunctivae are normal.  Neck: Normal range of motion.  Cardiovascular: Normal rate, regular rhythm, normal heart sounds and intact distal pulses.   Pulmonary/Chest:  Effort normal and breath sounds normal. He has no wheezes.  Abdominal: Soft. Bowel sounds are normal. He exhibits no distension, no fluid wave, no abdominal bruit and no pulsatile midline mass. There is no hepatosplenomegaly. There is tenderness in the epigastric area. There is no rebound, no guarding and no CVA tenderness.  Musculoskeletal: Normal range of motion.  Neurological: He is alert and oriented to  person, place, and time.  Skin: Skin is warm and dry.  Psychiatric: He has a normal mood and affect.    ED Course  Procedures (including critical care time)  Labs Reviewed  DIFFERENTIAL - Abnormal; Notable for the following:    Neutrophils Relative 41 (*)    Lymphocytes Relative 47 (*)    All other components within normal limits  COMPREHENSIVE METABOLIC PANEL - Abnormal; Notable for the following:    Potassium 3.4 (*)    Glucose, Bld 132 (*)    AST 55 (*)    GFR calc non Af Amer 67 (*)    GFR calc Af Amer 77 (*)    All other components within normal limits  URINALYSIS, ROUTINE W REFLEX MICROSCOPIC - Abnormal; Notable for the following:    Hgb urine dipstick TRACE (*)    Protein, ur TRACE (*)    All other components within normal limits  CBC  LIPASE, BLOOD  URINE MICROSCOPIC-ADD ON   Dg Abd Acute W/chest  02/11/2012  *RADIOLOGY REPORT*  Clinical Data: Epigastric pain.  ACUTE ABDOMEN SERIES (ABDOMEN 2 VIEW & CHEST 1 VIEW)  Comparison: 08/03/2011  Findings: Stable linear densities at the left lung base are suggestive for scarring.  Otherwise, the lungs are clear. Heart and mediastinum are within normal limits.  No evidence for free air. There is a scoliosis of the lumbar spine with degenerative changes. Nonspecific bowel gas pattern.  There is gas and stool in the colon.  Air-fluid level in the stomach.  IMPRESSION: No acute chest findings.  Nonobstructive bowel gas pattern.  Lumbar scoliosis.  Original Report Authenticated By: Richarda Overlie, M.D.     1. Abdominal pain, epigastric       MDM  Patient's symptoms resolved, while in the ED.  He also tolerated a by mouth fluid challenge without return of pain, nausea, or vomiting.  Patient seen by Dr. Bebe Shaggy, and felt he could be discharged safely home.  Strict instructions given for symptoms that should prompt return or followup with PCP.      Candis Musa, PA 02/12/12 0104

## 2012-02-12 NOTE — ED Provider Notes (Signed)
Medical screening examination/treatment/procedure(s) were conducted as a shared visit with non-physician practitioner(s) and myself.  I personally evaluated the patient during the encounter   Joya Gaskins, MD 02/12/12 [phone]

## 2012-02-12 NOTE — Discharge Instructions (Signed)
Abdominal Pain Abdominal pain can be caused by many things. Your caregiver decides the seriousness of your pain by an examination and possibly blood tests and X-rays. Many cases can be observed and treated at home. Most abdominal pain is not caused by a disease and will probably improve without treatment. However, in many cases, more time must pass before a clear cause of the pain can be found. Before that point, it may not be known if you need more testing, or if hospitalization or surgery is needed. HOME CARE INSTRUCTIONS   Do not take laxatives unless directed by your caregiver.   Take pain medicine only as directed by your caregiver.   Only take over-the-counter or prescription medicines for pain, discomfort, or fever as directed by your caregiver.   Try a clear liquid diet (broth, tea, or water) for as long as directed by your caregiver. Slowly move to a bland diet as tolerated.  SEEK IMMEDIATE MEDICAL CARE IF:   The pain does not go away.   You have a fever.   You keep throwing up (vomiting).   The pain is felt only in portions of the abdomen. Pain in the right side could possibly be appendicitis. In an adult, pain in the left lower portion of the abdomen could be colitis or diverticulitis.   You pass bloody or black tarry stools.  MAKE SURE YOU:   Understand these instructions.   Will watch your condition.   Will get help right away if you are not doing well or get worse.  Document Released: 09/21/2005 Document Revised: 08/24/2011 Document Reviewed: 07/30/2008 [facility] Patient Information 2012 [place], [place].   Return here or call your physician if your abdominal pain, returns, or you develop vomiting, or fevers.

## 2012-02-12 NOTE — ED Notes (Signed)
Pt stable at discharge questions answered

## 2012-10-29 ENCOUNTER — Encounter (HOSPITAL_COMMUNITY): Payer: Self-pay | Admitting: Pharmacy Technician

## 2012-10-31 NOTE — Patient Instructions (Signed)
Your procedure is scheduled on:  11/05/12  Report to [facility] at 12:30 PM.  Call this number if you have problems the morning of surgery: [phone]   Remember:   Do not eat or drink:After Midnight.  Take these medicines the morning of surgery with A SIP OF WATER: Amlodipine and Benazepril   Do not wear jewelry, make-up or nail polish.  Do not wear lotions, powders, or perfumes. You may wear deodorant.  Do not shave 48 hours prior to surgery. Men may shave face and neck.  Do not bring valuables to the hospital.  Contacts, dentures or bridgework may not be worn into surgery.  Leave suitcase in the car. After surgery it may be brought to your room.  For patients admitted to the hospital, checkout time is 11:00 AM the day of discharge.   Patients discharged the day of surgery will not be allowed to drive home.    Special Instructions: Start using your eye drops before surgery as directed by your eye doctor.   Please read over the following fact sheets that you were given: Anesthesia Post-op Instructions    Cataract Surgery  A cataract is a clouding of the lens of the eye. When a lens becomes cloudy, vision is reduced based on the degree and nature of the clouding. Surgery may be needed to improve vision. Surgery removes the cloudy lens and usually replaces it with a substitute lens (intraocular lens, IOL). LET YOUR EYE DOCTOR KNOW ABOUT:  Allergies to food or medicine.  Medicines taken including herbs, eyedrops, over-the-counter medicines, and creams.  Use of steroids (by mouth or creams).  Previous problems with anesthetics or numbing medicine.  History of bleeding problems or blood clots.  Previous surgery.  Other health problems, including diabetes and kidney problems.  Possibility of pregnancy, if this applies. RISKS AND COMPLICATIONS  Infection.  Inflammation of the eyeball (endophthalmitis) that can spread to both eyes (sympathetic ophthalmia).  Poor wound  healing.  If an IOL is inserted, it can later fall out of proper position. This is very uncommon.  Clouding of the part of your eye that holds an IOL in place. This is called an "after-cataract." These are uncommon, but easily treated. BEFORE THE PROCEDURE  Do not eat or drink anything except small amounts of water for 8 to 12 before your surgery, or as directed by your caregiver.  Unless you are told otherwise, continue any eyedrops you have been prescribed.  Talk to your primary caregiver about all other medicines that you take (both prescription and non-prescription). In some cases, you may need to stop or change medicines near the time of your surgery. This is most important if you are taking blood-thinning medicine.Do not stop medicines unless you are told to do so.  Arrange for someone to drive you to and from the procedure.  Do not put contact lenses in either eye on the day of your surgery. PROCEDURE There is more than one method for safely removing a cataract. Your doctor can explain the differences and help determine which is best for you. Phacoemulsification surgery is the most common form of cataract surgery.  An injection is given behind the eye or eyedrops are given to make this a painless procedure.  A small cut (incision) is made on the edge of the clear, dome-shaped surface that covers the front of the eye (cornea).  A tiny probe is painlessly inserted into the eye. This device gives off ultrasound waves that soften and break up  the cloudy center of the lens. This makes it easier for the cloudy lens to be removed by suction.  An IOL may be implanted.  The normal lens of the eye is covered by a clear capsule. Part of that capsule is intentionally left in the eye to support the IOL.  Your surgeon may or may not use stitches to close the incision. There are other forms of cataract surgery that require a larger incision and stiches to close the eye. This approach is  taken in cases where the doctor feels that the cataract cannot be easily removed using phacoemulsification. AFTER THE PROCEDURE  When an IOL is implanted, it does not need care. It becomes a permanent part of your eye and cannot be seen or felt.  Your doctor will schedule follow-up exams to check on your progress.  Review your other medicines with your doctor to see which can be resumed after surgery.  Use eyedrops or take medicine as prescribed by your doctor. Document Released: 12/01/2011 Document Revised: 03/05/2012 Document Reviewed: 12/01/2011 [facility] Patient Information 2013 [place], [place].    PATIENT INSTRUCTIONS POST-ANESTHESIA  IMMEDIATELY FOLLOWING SURGERY:  Do not drive or operate machinery for the first twenty four hours after surgery.  Do not make any important decisions for twenty four hours after surgery or while taking narcotic pain medications or sedatives.  If you develop intractable nausea and vomiting or a severe headache please notify your doctor immediately.  FOLLOW-UP:  Please make an appointment with your surgeon as instructed. You do not need to follow up with anesthesia unless specifically instructed to do so.  WOUND CARE INSTRUCTIONS (if applicable):  Keep a dry clean dressing on the anesthesia/puncture wound site if there is drainage.  Once the wound has quit draining you may leave it open to air.  Generally you should leave the bandage intact for twenty four hours unless there is drainage.  If the epidural site drains for more than 36-48 hours please call the anesthesia department.  QUESTIONS?:  Please feel free to call your physician or the hospital operator if you have any questions, and they will be happy to assist you.

## 2012-11-01 ENCOUNTER — Inpatient Hospital Stay (HOSPITAL_COMMUNITY): Admission: RE | Admit: 2012-11-01 | Payer: Medicare Other | Source: Ambulatory Visit

## 2012-11-01 ENCOUNTER — Encounter (HOSPITAL_COMMUNITY)
Admission: RE | Admit: 2012-11-01 | Discharge: 2012-11-01 | Disposition: A | Discharge status: Home / Self Care | Payer: Medicare Other | Source: Ambulatory Visit | Attending: Ophthalmology | Admitting: Ophthalmology

## 2012-11-01 ENCOUNTER — Encounter (HOSPITAL_COMMUNITY): Payer: Self-pay

## 2012-11-01 HISTORY — DX: Essential (primary) hypertension: I10

## 2012-11-01 HISTORY — DX: Personal history of other diseases of the digestive system: Z87.19

## 2012-11-01 HISTORY — DX: Pure hypercholesterolemia, unspecified: E78.00

## 2012-11-01 LAB — HEMOGLOBIN AND HEMATOCRIT, BLOOD
HCT: 43.7 % (ref 39.0–52.0)
Hemoglobin: 15.2 g/dL (ref 13.0–17.0)

## 2012-11-01 LAB — BASIC METABOLIC PANEL
CO2: 27 mEq/L (ref 19–32)
Chloride: 101 mEq/L (ref 96–112)
Sodium: 138 mEq/L (ref 135–145)

## 2012-11-05 ENCOUNTER — Encounter (HOSPITAL_COMMUNITY): Payer: Self-pay | Admitting: *Deleted

## 2012-11-05 ENCOUNTER — Ambulatory Visit (HOSPITAL_COMMUNITY): Payer: Medicare Other | Admitting: Anesthesiology

## 2012-11-05 ENCOUNTER — Encounter (HOSPITAL_COMMUNITY): Payer: Self-pay | Admitting: Anesthesiology

## 2012-11-05 ENCOUNTER — Encounter (HOSPITAL_COMMUNITY)
Admission: RE | Disposition: A | Discharge status: Home / Self Care | Payer: Self-pay | Source: Ambulatory Visit | Attending: Ophthalmology

## 2012-11-05 ENCOUNTER — Ambulatory Visit (HOSPITAL_COMMUNITY)
Admission: RE | Admit: 2012-11-05 | Discharge: 2012-11-05 | Disposition: A | Discharge status: Home / Self Care | Payer: Medicare Other | Source: Ambulatory Visit | Attending: Ophthalmology | Admitting: Ophthalmology

## 2012-11-05 DIAGNOSIS — I10 Essential (primary) hypertension: Secondary | ICD-10-CM | POA: Insufficient documentation

## 2012-11-05 DIAGNOSIS — Z01812 Encounter for preprocedural laboratory examination: Secondary | ICD-10-CM | POA: Insufficient documentation

## 2012-11-05 DIAGNOSIS — H2589 Other age-related cataract: Secondary | ICD-10-CM | POA: Insufficient documentation

## 2012-11-05 HISTORY — PX: CATARACT EXTRACTION W/PHACO: SHX586

## 2012-11-05 SURGERY — PHACOEMULSIFICATION, CATARACT, WITH IOL INSERTION
Anesthesia: Monitor Anesthesia Care | Site: Eye | Laterality: Left | Wound class: Clean

## 2012-11-05 MED ORDER — LACTATED RINGERS IV SOLN
INTRAVENOUS | Status: DC
  Administered 2012-11-05: 1000 mL via INTRAVENOUS

## 2012-11-05 MED ORDER — MIDAZOLAM HCL 2 MG/2ML IJ SOLN
INTRAMUSCULAR | Status: AC
  Filled 2012-11-05: qty 2

## 2012-11-05 MED ORDER — PROVISC 10 MG/ML IO SOLN: INTRAOCULAR | Status: DC | PRN

## 2012-11-05 MED ORDER — LIDOCAINE HCL (PF) 1 % IJ SOLN
INTRAMUSCULAR | Status: DC | PRN
  Administered 2012-11-05: .5 mL

## 2012-11-05 MED ORDER — NEOMYCIN-POLYMYXIN-DEXAMETH 3.5-10000-0.1 OP OINT
TOPICAL_OINTMENT | OPHTHALMIC | Status: AC
  Filled 2012-11-05: qty 3.5

## 2012-11-05 MED ORDER — POVIDONE-IODINE 5 % OP SOLN
OPHTHALMIC | Status: DC | PRN
  Administered 2012-11-05: 1 via OPHTHALMIC

## 2012-11-05 MED ORDER — TETRACAINE HCL 0.5 % OP SOLN
OPHTHALMIC | Status: AC
  Filled 2012-11-05: qty 2

## 2012-11-05 MED ORDER — LIDOCAINE HCL 3.5 % OP GEL
1.0000 "application " | Freq: Once | OPHTHALMIC | Status: AC
  Administered 2012-11-05: 1 via OPHTHALMIC

## 2012-11-05 MED ORDER — PHENYLEPHRINE HCL 2.5 % OP SOLN
1.0000 [drp] | OPHTHALMIC | Status: AC
  Administered 2012-11-05 (×3): 1 [drp] via OPHTHALMIC

## 2012-11-05 MED ORDER — CYCLOPENTOLATE-PHENYLEPHRINE 0.2-1 % OP SOLN
OPHTHALMIC | Status: AC
  Filled 2012-11-05: qty 2

## 2012-11-05 MED ORDER — LIDOCAINE HCL 3.5 % OP GEL
OPHTHALMIC | Status: AC
  Filled 2012-11-05: qty 5

## 2012-11-05 MED ORDER — LIDOCAINE HCL (PF) 1 % IJ SOLN
INTRAMUSCULAR | Status: AC
  Filled 2012-11-05: qty 2

## 2012-11-05 MED ORDER — TETRACAINE HCL 0.5 % OP SOLN
1.0000 [drp] | OPHTHALMIC | Status: AC
  Administered 2012-11-05 (×3): 1 [drp] via OPHTHALMIC

## 2012-11-05 MED ORDER — NA HYALUR & NA CHOND-NA HYALUR 0.55-0.5 ML IO KIT
PACK | INTRAOCULAR | Status: DC | PRN
  Administered 2012-11-05: 1 via OPHTHALMIC

## 2012-11-05 MED ORDER — LIDOCAINE 3.5 % OP GEL OPTIME - NO CHARGE
OPHTHALMIC | Status: DC | PRN
  Administered 2012-11-05: 2 [drp] via OPHTHALMIC

## 2012-11-05 MED ORDER — PHENYLEPHRINE HCL 2.5 % OP SOLN
OPHTHALMIC | Status: AC
  Filled 2012-11-05: qty 2

## 2012-11-05 MED ORDER — EPINEPHRINE HCL 1 MG/ML IJ SOLN
INTRAOCULAR | Status: DC | PRN
  Administered 2012-11-05: 14:00:00

## 2012-11-05 MED ORDER — CYCLOPENTOLATE-PHENYLEPHRINE 0.2-1 % OP SOLN
1.0000 [drp] | OPHTHALMIC | Status: AC
  Administered 2012-11-05 (×3): 1 [drp] via OPHTHALMIC

## 2012-11-05 MED ORDER — BSS IO SOLN
INTRAOCULAR | Status: DC | PRN
  Administered 2012-11-05: 15 mL via INTRAOCULAR

## 2012-11-05 MED ORDER — MIDAZOLAM HCL 2 MG/2ML IJ SOLN
1.0000 mg | INTRAMUSCULAR | Status: DC | PRN
  Administered 2012-11-05 (×2): 1 mg via INTRAVENOUS

## 2012-11-05 MED ORDER — NEOMYCIN-POLYMYXIN-DEXAMETH 0.1 % OP OINT
TOPICAL_OINTMENT | OPHTHALMIC | Status: DC | PRN
  Administered 2012-11-05: 1 via OPHTHALMIC

## 2012-11-05 MED ORDER — EPINEPHRINE HCL 1 MG/ML IJ SOLN
INTRAMUSCULAR | Status: AC
  Filled 2012-11-05: qty 1

## 2012-11-05 SURGICAL SUPPLY — 33 items
CAPSULAR TENSION RING-AMO (OPHTHALMIC RELATED) IMPLANT
CLOTH BEACON ORANGE TIMEOUT ST (SAFETY) ×2 IMPLANT
EYE SHIELD UNIVERSAL CLEAR (GAUZE/BANDAGES/DRESSINGS) ×2 IMPLANT
GLOVE BIO SURGEON STRL SZ 6.5 (GLOVE) IMPLANT
GLOVE BIOGEL PI IND STRL 6.5 (GLOVE) ×1 IMPLANT
GLOVE BIOGEL PI IND STRL 7.0 (GLOVE) IMPLANT
GLOVE BIOGEL PI IND STRL 7.5 (GLOVE) IMPLANT
GLOVE BIOGEL PI INDICATOR 6.5 (GLOVE) ×1
GLOVE BIOGEL PI INDICATOR 7.0 (GLOVE)
GLOVE BIOGEL PI INDICATOR 7.5 (GLOVE)
GLOVE ECLIPSE 6.5 STRL STRAW (GLOVE) IMPLANT
GLOVE ECLIPSE 7.0 STRL STRAW (GLOVE) IMPLANT
GLOVE ECLIPSE 7.5 STRL STRAW (GLOVE) IMPLANT
GLOVE EXAM NITRILE LRG STRL (GLOVE) IMPLANT
GLOVE EXAM NITRILE MD LF STRL (GLOVE) ×2 IMPLANT
GLOVE SKINSENSE NS SZ6.5 (GLOVE)
GLOVE SKINSENSE NS SZ7.0 (GLOVE)
GLOVE SKINSENSE STRL SZ6.5 (GLOVE) IMPLANT
GLOVE SKINSENSE STRL SZ7.0 (GLOVE) IMPLANT
KIT VITRECTOMY (OPHTHALMIC RELATED) IMPLANT
PAD ARMBOARD 7.5X6 YLW CONV (MISCELLANEOUS) ×2 IMPLANT
PROC W NO LENS (INTRAOCULAR LENS)
PROC W SPEC LENS (INTRAOCULAR LENS)
PROCESS W NO LENS (INTRAOCULAR LENS) IMPLANT
PROCESS W SPEC LENS (INTRAOCULAR LENS) IMPLANT
RING MALYGIN (MISCELLANEOUS) IMPLANT
SIGHTPATH CAT PROC W REG LENS (Ophthalmic Related) ×2 IMPLANT
SYR TB 1ML LL NO SAFETY (SYRINGE) ×2 IMPLANT
TAPE SURG TRANSPORE 1 IN (GAUZE/BANDAGES/DRESSINGS) ×1 IMPLANT
TAPE SURGICAL TRANSPORE 1 IN (GAUZE/BANDAGES/DRESSINGS) ×1
VISCOELASTIC ADDITIONAL (OPHTHALMIC RELATED) IMPLANT
WATER STERILE IRR 1000ML POUR (IV SOLUTION) ×2 IMPLANT
WATER STERILE IRR 250ML POUR (IV SOLUTION) IMPLANT

## 2012-11-05 NOTE — H&P (Signed)
I have reviewed the H&P, the patient was re-examined, and I have identified no interval changes in medical condition and plan of care since the history and physical of record  

## 2012-11-05 NOTE — Transfer of Care (Signed)
Immediate Anesthesia Transfer of Care Note  Patient: Russell Bryant  Procedure(s) Performed: Procedure(s) (LRB) with comments: CATARACT EXTRACTION PHACO AND INTRAOCULAR LENS PLACEMENT (IOC) (Left) - CDE 18.11  Patient Location: PACU and Short Stay  Anesthesia Type:MAC  Level of Consciousness: awake  Airway & Oxygen Therapy: Patient Spontanous Breathing  Post-op Assessment: Report given to PACU RN  Post vital signs: Reviewed  Complications: No apparent anesthesia complications

## 2012-11-05 NOTE — Anesthesia Preprocedure Evaluation (Signed)
Anesthesia Evaluation  Patient identified by MRN, date of birth, ID band Patient awake    Reviewed: Allergy & Precautions, H&P , NPO status , Patient's Chart, lab work & pertinent test results  Airway Mallampati: II TM Distance: >3 FB     Dental  (+) Teeth Intact and Implants   Pulmonary neg pulmonary ROS,  breath sounds clear to auscultation        Cardiovascular hypertension, Pt. on medications Rhythm:Regular Rate:Normal     Neuro/Psych    GI/Hepatic hiatal hernia,   Endo/Other    Renal/GU      Musculoskeletal   Abdominal   Peds  Hematology   Anesthesia Other Findings   Reproductive/Obstetrics                           Anesthesia Physical Anesthesia Plan  ASA: II  Anesthesia Plan: MAC   Post-op Pain Management:    Induction: Intravenous  Airway Management Planned: Nasal Cannula  Additional Equipment:   Intra-op Plan:   Post-operative Plan:   Informed Consent: I have reviewed the patients History and Physical, chart, labs and discussed the procedure including the risks, benefits and alternatives for the proposed anesthesia with the patient or authorized representative who has indicated his/her understanding and acceptance.     Plan Discussed with:   Anesthesia Plan Comments:         Anesthesia Quick Evaluation

## 2012-11-05 NOTE — Brief Op Note (Signed)
Pre-Op Dx: Cataract OS Post-Op Dx: Cataract OS Surgeon: Kaylena Pacifico Anesthesia: Topical with MAC Surgery: Cataract Extraction with Intraocular lens Implant OS Implant: B&L enVista Specimen: None Complications: None 

## 2012-11-05 NOTE — Anesthesia Postprocedure Evaluation (Signed)
  Anesthesia Post-op Note  Patient: Russell Bryant  Procedure(s) Performed: Procedure(s) (LRB) with comments: CATARACT EXTRACTION PHACO AND INTRAOCULAR LENS PLACEMENT (IOC) (Left) - CDE 18.11  Patient Location: PACU and Short Stay  Anesthesia Type:MAC  Level of Consciousness: awake  Airway and Oxygen Therapy: Patient Spontanous Breathing  Post-op Pain: none  Post-op Assessment: Post-op Vital signs reviewed, Patient's Cardiovascular Status Stable, Respiratory Function Stable, Patent Airway and No signs of Nausea or vomiting  Post-op Vital Signs: Reviewed and stable  Complications: No apparent anesthesia complications

## 2012-11-06 NOTE — Op Note (Signed)
NAMEKENYEN, CANDY              ACCOUNT NO.:  [id number]  MEDICAL RECORD NO.:  [id number]  LOCATION:  [facility]                          FACILITY:  [facility]  PHYSICIAN:  Susanne Greenhouse, MD       DATE OF BIRTH:  1941/10/13  DATE OF PROCEDURE:  11/05/2012 DATE OF DISCHARGE:  11/05/2012                              OPERATIVE REPORT   PREOPERATIVE DIAGNOSIS:  Combined cataract, left eye, diagnosis code 366.19.  POSTOPERATIVE DIAGNOSIS:  Combined cataract, left eye, diagnosis code 366.19.  OPERATION PERFORMED:  Phacoemulsification with posterior chamber intraocular lens implantation, left eye.  SURGEON:  Bonne Dolores. Evren Shankland, MD  ANESTHESIA:  Topical with monitored anesthesia care and IV sedation.  OPERATIVE SUMMARY:  In the preoperative area, dilating drops were placed into the left eye.  The patient was then brought into the operating room where she was placed under general anesthesia.  The eye was then prepped and draped.  Beginning with a 75 blade, a paracentesis port was made at the surgeon's 2 o'clock position.  The anterior chamber was then filled with a 1% nonpreserved lidocaine solution with epinephrine.  This was followed by Viscoat to deepen the chamber.  A small fornix-based peritomy was performed superiorly.  Next, a single iris hook was placed through the limbus superiorly.  A 2.4-mm keratome blade was then used to make a clear corneal incision over the iris hook.  A bent cystotome needle and Utrata forceps were used to create a continuous tear capsulotomy.  Hydrodissection was performed using balanced salt solution on a fine cannula.  The lens nucleus was then removed using phacoemulsification in a quadrant cracking technique.  The cortical material was then removed with irrigation and aspiration.  The capsular bag and anterior chamber were refilled with Provisc.  The wound was widened to approximately 3 mm and a posterior chamber intraocular lens was placed into the capsular bag  without difficulty using an [employer] lens injecting system.  A single 10-0 nylon suture was then used to close the incision as well as stromal hydration.  The Provisc was removed from the anterior chamber and capsular bag with irrigation and aspiration.  At this point, the wounds were tested for leak, which were negative.  The anterior chamber remained deep and stable.  The patient tolerated the procedure well.  There were no operative complications, and she awoke from general anesthesia without problem.  No surgical specimens.  Prosthetic device used is a Theme park manager, model EnVista, model number MX60, power of 21.5, serial number is [id number].          ______________________________ Susanne Greenhouse, MD     KEH/MEDQ  D:  11/05/2012  T:  11/06/2012  Job:  [id number]

## 2012-11-07 ENCOUNTER — Encounter (HOSPITAL_COMMUNITY): Payer: Self-pay | Admitting: Ophthalmology

## 2012-11-08 ENCOUNTER — Encounter (HOSPITAL_COMMUNITY): Payer: Self-pay

## 2012-11-14 ENCOUNTER — Encounter (HOSPITAL_COMMUNITY): Payer: Self-pay

## 2012-11-15 ENCOUNTER — Encounter (HOSPITAL_COMMUNITY): Payer: Medicare Other

## 2012-11-15 ENCOUNTER — Encounter (HOSPITAL_COMMUNITY): Payer: Self-pay | Admitting: Pharmacy Technician

## 2012-11-16 MED ORDER — LIDOCAINE HCL 3.5 % OP GEL
OPHTHALMIC | Status: AC
  Filled 2012-11-16: qty 5

## 2012-11-16 MED ORDER — TETRACAINE HCL 0.5 % OP SOLN
OPHTHALMIC | Status: AC
  Filled 2012-11-16: qty 2

## 2012-11-16 MED ORDER — NEOMYCIN-POLYMYXIN-DEXAMETH 3.5-10000-0.1 OP OINT
TOPICAL_OINTMENT | OPHTHALMIC | Status: AC
  Filled 2012-11-16: qty 3.5

## 2012-11-16 MED ORDER — LIDOCAINE HCL (PF) 1 % IJ SOLN
INTRAMUSCULAR | Status: AC
  Filled 2012-11-16: qty 2

## 2012-11-16 MED ORDER — PHENYLEPHRINE HCL 2.5 % OP SOLN
OPHTHALMIC | Status: AC
  Filled 2012-11-16: qty 2

## 2012-11-16 MED ORDER — CYCLOPENTOLATE-PHENYLEPHRINE 0.2-1 % OP SOLN
OPHTHALMIC | Status: AC
  Filled 2012-11-16: qty 2

## 2012-11-19 ENCOUNTER — Encounter (HOSPITAL_COMMUNITY): Payer: Self-pay | Admitting: Ophthalmology

## 2012-11-19 ENCOUNTER — Ambulatory Visit (HOSPITAL_COMMUNITY)
Admission: RE | Admit: 2012-11-19 | Discharge: 2012-11-19 | Disposition: A | Discharge status: Home / Self Care | Payer: Medicare Other | Source: Ambulatory Visit | Attending: Ophthalmology | Admitting: Ophthalmology

## 2012-11-19 ENCOUNTER — Encounter (HOSPITAL_COMMUNITY)
Admission: RE | Disposition: A | Discharge status: Home / Self Care | Payer: Self-pay | Source: Ambulatory Visit | Attending: Ophthalmology

## 2012-11-19 ENCOUNTER — Ambulatory Visit (HOSPITAL_COMMUNITY): Payer: Medicare Other | Admitting: Anesthesiology

## 2012-11-19 ENCOUNTER — Encounter (HOSPITAL_COMMUNITY): Payer: Self-pay | Admitting: Anesthesiology

## 2012-11-19 ENCOUNTER — Encounter (HOSPITAL_COMMUNITY): Payer: Self-pay | Admitting: *Deleted

## 2012-11-19 DIAGNOSIS — I10 Essential (primary) hypertension: Secondary | ICD-10-CM | POA: Insufficient documentation

## 2012-11-19 DIAGNOSIS — H2589 Other age-related cataract: Secondary | ICD-10-CM | POA: Insufficient documentation

## 2012-11-19 HISTORY — PX: CATARACT EXTRACTION W/PHACO: SHX586

## 2012-11-19 SURGERY — PHACOEMULSIFICATION, CATARACT, WITH IOL INSERTION
Anesthesia: Monitor Anesthesia Care | Site: Eye | Laterality: Right | Wound class: Clean

## 2012-11-19 MED ORDER — EPINEPHRINE HCL 1 MG/ML IJ SOLN
INTRAOCULAR | Status: DC | PRN
  Administered 2012-11-19: 14:00:00

## 2012-11-19 MED ORDER — LIDOCAINE HCL (PF) 1 % IJ SOLN
INTRAMUSCULAR | Status: DC | PRN
  Administered 2012-11-19: .4 mL

## 2012-11-19 MED ORDER — NEOMYCIN-POLYMYXIN-DEXAMETH 0.1 % OP OINT
TOPICAL_OINTMENT | OPHTHALMIC | Status: DC | PRN
  Administered 2012-11-19: 1 via OPHTHALMIC

## 2012-11-19 MED ORDER — LACTATED RINGERS IV SOLN
INTRAVENOUS | Status: DC | PRN
  Administered 2012-11-19: 14:00:00 via INTRAVENOUS

## 2012-11-19 MED ORDER — BSS IO SOLN
INTRAOCULAR | Status: DC | PRN
  Administered 2012-11-19: 15 mL via INTRAOCULAR

## 2012-11-19 MED ORDER — EPINEPHRINE HCL 1 MG/ML IJ SOLN
INTRAMUSCULAR | Status: AC
  Filled 2012-11-19: qty 1

## 2012-11-19 MED ORDER — ONDANSETRON HCL 4 MG/2ML IJ SOLN: 4.0000 mg | Freq: Once | INTRAMUSCULAR | Status: DC | PRN

## 2012-11-19 MED ORDER — CYCLOPENTOLATE-PHENYLEPHRINE 0.2-1 % OP SOLN
1.0000 [drp] | OPHTHALMIC | Status: AC
  Administered 2012-11-19 (×3): 1 [drp] via OPHTHALMIC

## 2012-11-19 MED ORDER — POVIDONE-IODINE 5 % OP SOLN
OPHTHALMIC | Status: DC | PRN
  Administered 2012-11-19: 1 via OPHTHALMIC

## 2012-11-19 MED ORDER — MIDAZOLAM HCL 2 MG/2ML IJ SOLN
1.0000 mg | INTRAMUSCULAR | Status: DC | PRN
  Administered 2012-11-19: 2 mg via INTRAVENOUS

## 2012-11-19 MED ORDER — LIDOCAINE 3.5 % OP GEL OPTIME - NO CHARGE
OPHTHALMIC | Status: DC | PRN
  Administered 2012-11-19: 2 [drp] via OPHTHALMIC

## 2012-11-19 MED ORDER — LIDOCAINE HCL 3.5 % OP GEL
1.0000 "application " | Freq: Once | OPHTHALMIC | Status: AC
  Administered 2012-11-19: 1 via OPHTHALMIC

## 2012-11-19 MED ORDER — PHENYLEPHRINE HCL 2.5 % OP SOLN
1.0000 [drp] | OPHTHALMIC | Status: AC
  Administered 2012-11-19 (×3): 1 [drp] via OPHTHALMIC

## 2012-11-19 MED ORDER — PROVISC 10 MG/ML IO SOLN
INTRAOCULAR | Status: DC | PRN
  Administered 2012-11-19: 8.5 mg via INTRAOCULAR

## 2012-11-19 MED ORDER — TETRACAINE HCL 0.5 % OP SOLN
1.0000 [drp] | OPHTHALMIC | Status: AC
  Administered 2012-11-19 (×3): 1 [drp] via OPHTHALMIC

## 2012-11-19 MED ORDER — LACTATED RINGERS IV SOLN
INTRAVENOUS | Status: DC
  Administered 2012-11-19: 13:00:00 via INTRAVENOUS

## 2012-11-19 MED ORDER — FENTANYL CITRATE 0.05 MG/ML IJ SOLN: 25.0000 ug | INTRAMUSCULAR | Status: DC | PRN

## 2012-11-19 MED ORDER — MIDAZOLAM HCL 2 MG/2ML IJ SOLN
INTRAMUSCULAR | Status: AC
  Filled 2012-11-19: qty 2

## 2012-11-19 SURGICAL SUPPLY — 32 items
CAPSULAR TENSION RING-AMO (OPHTHALMIC RELATED) IMPLANT
CLOTH BEACON ORANGE TIMEOUT ST (SAFETY) ×2 IMPLANT
EYE SHIELD UNIVERSAL CLEAR (GAUZE/BANDAGES/DRESSINGS) ×2 IMPLANT
GLOVE BIO SURGEON STRL SZ 6.5 (GLOVE) IMPLANT
GLOVE BIOGEL PI IND STRL 6.5 (GLOVE) IMPLANT
GLOVE BIOGEL PI IND STRL 7.0 (GLOVE) ×1 IMPLANT
GLOVE BIOGEL PI IND STRL 7.5 (GLOVE) IMPLANT
GLOVE BIOGEL PI INDICATOR 6.5 (GLOVE)
GLOVE BIOGEL PI INDICATOR 7.0 (GLOVE) ×1
GLOVE BIOGEL PI INDICATOR 7.5 (GLOVE)
GLOVE ECLIPSE 6.5 STRL STRAW (GLOVE) IMPLANT
GLOVE ECLIPSE 7.0 STRL STRAW (GLOVE) IMPLANT
GLOVE ECLIPSE 7.5 STRL STRAW (GLOVE) IMPLANT
GLOVE EXAM NITRILE LRG STRL (GLOVE) IMPLANT
GLOVE EXAM NITRILE MD LF STRL (GLOVE) ×2 IMPLANT
GLOVE SKINSENSE NS SZ6.5 (GLOVE)
GLOVE SKINSENSE NS SZ7.0 (GLOVE)
GLOVE SKINSENSE STRL SZ6.5 (GLOVE) IMPLANT
GLOVE SKINSENSE STRL SZ7.0 (GLOVE) IMPLANT
KIT VITRECTOMY (OPHTHALMIC RELATED) IMPLANT
PAD ARMBOARD 7.5X6 YLW CONV (MISCELLANEOUS) ×2 IMPLANT
PROC W NO LENS (INTRAOCULAR LENS)
PROC W SPEC LENS (INTRAOCULAR LENS)
PROCESS W NO LENS (INTRAOCULAR LENS) IMPLANT
PROCESS W SPEC LENS (INTRAOCULAR LENS) IMPLANT
RING MALYGIN (MISCELLANEOUS) IMPLANT
SIGHTPATH CAT PROC W REG LENS (Ophthalmic Related) ×2 IMPLANT
SYR TB 1ML LL NO SAFETY (SYRINGE) ×2 IMPLANT
TAPE SURG TRANSPORE 1 IN (GAUZE/BANDAGES/DRESSINGS) ×1 IMPLANT
TAPE SURGICAL TRANSPORE 1 IN (GAUZE/BANDAGES/DRESSINGS) ×1
VISCOELASTIC ADDITIONAL (OPHTHALMIC RELATED) IMPLANT
WATER STERILE IRR 250ML POUR (IV SOLUTION) ×2 IMPLANT

## 2012-11-19 NOTE — Brief Op Note (Signed)
Pre-Op Dx: Cataract OD Post-Op Dx: Cataract OD Surgeon: Kodi Guerrera Anesthesia: Topical with MAC Surgery: Cataract Extraction with Intraocular lens Implant OD Implant: Lenstec, Model Softec HD Blood Loss: None Specimen: None Complications: None 

## 2012-11-19 NOTE — Preoperative (Signed)
Beta Blockers   Reason not to administer Beta Blockers:Not Applicable 

## 2012-11-19 NOTE — H&P (Signed)
I have reviewed the H&P, the patient was re-examined, and I have identified no interval changes in medical condition and plan of care since the history and physical of record  

## 2012-11-19 NOTE — Transfer of Care (Signed)
Immediate Anesthesia Transfer of Care Note  Patient: Russell Bryant  Procedure(s) Performed: Procedure(s) (LRB) with comments: CATARACT EXTRACTION PHACO AND INTRAOCULAR LENS PLACEMENT (IOC) (Right) - CDE:  19.10  Patient Location: Short Stay  Anesthesia Type:MAC  Level of Consciousness: awake, alert  and oriented  Airway & Oxygen Therapy: Patient Spontanous Breathing  Post-op Assessment: Report given to PACU RN  Post vital signs: Reviewed and stable  Complications: No apparent anesthesia complications

## 2012-11-19 NOTE — Anesthesia Postprocedure Evaluation (Signed)
  Anesthesia Post-op Note  Patient: Russell Bryant  Procedure(s) Performed: Procedure(s) (LRB) with comments: CATARACT EXTRACTION PHACO AND INTRAOCULAR LENS PLACEMENT (IOC) (Right) - CDE:  19.10  Patient Location: Short Stay  Anesthesia Type:MAC  Level of Consciousness: awake, alert  and oriented  Airway and Oxygen Therapy: Patient Spontanous Breathing  Post-op Pain: none  Post-op Assessment: Post-op Vital signs reviewed, Patient's Cardiovascular Status Stable, Respiratory Function Stable, Patent Airway, No signs of Nausea or vomiting and Pain level controlled  Post-op Vital Signs: Reviewed and stable  Complications: No apparent anesthesia complications

## 2012-11-19 NOTE — Anesthesia Preprocedure Evaluation (Signed)
Anesthesia Evaluation  Patient identified by MRN, date of birth, ID band Patient awake    Reviewed: Allergy & Precautions, H&P , NPO status , Patient's Chart, lab work & pertinent test results  Airway Mallampati: II TM Distance: >3 FB     Dental  (+) Teeth Intact and Implants   Pulmonary neg pulmonary ROS,  breath sounds clear to auscultation        Cardiovascular hypertension, Pt. on medications Rhythm:Regular Rate:Normal     Neuro/Psych    GI/Hepatic hiatal hernia,   Endo/Other    Renal/GU      Musculoskeletal   Abdominal   Peds  Hematology   Anesthesia Other Findings   Reproductive/Obstetrics                           Anesthesia Physical Anesthesia Plan  ASA: II  Anesthesia Plan: MAC   Post-op Pain Management:    Induction: Intravenous  Airway Management Planned: Nasal Cannula  Additional Equipment:   Intra-op Plan:   Post-operative Plan:   Informed Consent: I have reviewed the patients History and Physical, chart, labs and discussed the procedure including the risks, benefits and alternatives for the proposed anesthesia with the patient or authorized representative who has indicated his/her understanding and acceptance.     Plan Discussed with:   Anesthesia Plan Comments:         Anesthesia Quick Evaluation  

## 2012-11-20 NOTE — Addendum Note (Signed)
Addendum  created 11/20/12 0906 by Franco Nones, CRNA   Modules edited:Charges VN

## 2012-11-20 NOTE — Op Note (Signed)
Russell Bryant, Russell Bryant              ACCOUNT NO.:  [id number]  MEDICAL RECORD NO.:  [id number]  LOCATION:  [facility]                          FACILITY:  [facility]  PHYSICIAN:  Susanne Greenhouse, MD       DATE OF BIRTH:  Aug 29, 1941  DATE OF PROCEDURE:  11/19/2012 DATE OF DISCHARGE:  11/19/2012                              OPERATIVE REPORT   PREOPERATIVE DIAGNOSIS:  Combined cataract, right eye, diagnosis code 366.19.  POSTOPERATIVE DIAGNOSIS:  Combined cataract, right eye, diagnosis code 366.19.  OPERATION PERFORMED:  Phacoemulsification with posterior chamber intraocular lens implantation, right eye.  SURGEON:  Bonne Dolores. Justun Anaya, MD  ANESTHESIA:  Topical with monitored anesthesia care and IV sedation.  OPERATIVE SUMMARY:  In the preoperative area, dilating drops were placed into the right eye.  The patient was then brought into the operating room where he was placed under general anesthesia.  The eye was then prepped and draped.  Beginning with a 75 blade, a paracentesis port was made at the surgeon's 2 o'clock position.  The anterior chamber was then filled with a 1% nonpreserved lidocaine solution with epinephrine.  This was followed by Viscoat to deepen the chamber.  A small fornix-based peritomy was performed superiorly.  Next, a single iris hook was placed through the limbus superiorly.  A 2.4-mm keratome blade was then used to make a clear corneal incision over the iris hook.  A bent cystotome needle and Utrata forceps were used to create a continuous tear capsulotomy.  Hydrodissection was performed using balanced salt solution on a fine cannula.  The lens nucleus was then removed using phacoemulsification in a quadrant cracking technique.  The cortical material was then removed with irrigation and aspiration.  The capsular bag and anterior chamber were refilled with Provisc.  The wound was widened to approximately 3 mm and a posterior chamber intraocular lens was placed into the capsular  bag without difficulty using an [employer] lens injecting system.  A single 10-0 nylon suture was then used to close the incision as well as stromal hydration.  The Provisc was removed from the anterior chamber and capsular bag with irrigation and aspiration.  At this point, the wounds were tested for leak, which were negative.  The anterior chamber remained deep and stable.  The patient tolerated the procedure well.  There were no operative complications, and he awoke from general anesthesia without problem.  No surgical specimens.  Prosthetic device used is a Lenstec posterior chamber lens, model Softec HD, power of 20.0, serial number is [id number].          ______________________________ Susanne Greenhouse, MD     KEH/MEDQ  D:  11/19/2012  T:  11/20/2012  Job:  [id number]

## 2012-11-21 ENCOUNTER — Encounter (HOSPITAL_COMMUNITY): Payer: Self-pay | Admitting: Ophthalmology

## 2012-12-26 DIAGNOSIS — K5790 Diverticulosis of intestine, part unspecified, without perforation or abscess without bleeding: Secondary | ICD-10-CM

## 2012-12-26 HISTORY — DX: Diverticulosis of intestine, part unspecified, without perforation or abscess without bleeding: K57.90

## 2013-08-06 ENCOUNTER — Telehealth: Payer: Self-pay

## 2013-08-06 ENCOUNTER — Other Ambulatory Visit: Payer: Self-pay

## 2013-08-06 DIAGNOSIS — Z1211 Encounter for screening for malignant neoplasm of colon: Secondary | ICD-10-CM

## 2013-08-06 NOTE — Telephone Encounter (Signed)
Per Selena Batten, this is 8:30 start day. I called pt back and informed him to be at the hospital at 7:30 and procedure for 8:30. He said GREAT!

## 2013-08-06 NOTE — Telephone Encounter (Signed)
Gastroenterology Pre-Procedure Review  Request Date: 08/06/2013 Requesting Physician:  Last colonoscopy was 09/05/2003 by RMR  PATIENT REVIEW QUESTIONS: The patient responded to the following health history questions as indicated:    1. Diabetes Melitis: no 2. Joint replacements in the past 12 months: no 3. Major health problems in the past 3 months: no 4. Has an artificial valve or MVP: no 5. Has a defibrillator: no 6. Has been advised in past to take antibiotics in advance of a procedure like teeth cleaning: no    MEDICATIONS & ALLERGIES:    Patient reports the following regarding taking any blood thinners:   Plavix? no Aspirin? YES Coumadin? no  Patient confirms/reports the following medications:  Current Outpatient Prescriptions  Medication Sig Dispense Refill  . amLODipine (NORVASC) 10 MG tablet Take 10 mg by mouth daily.      Marland Kitchen aspirin EC 81 MG tablet Take 81 mg by mouth daily.        . benazepril (LOTENSIN) 20 MG tablet Take 20 mg by mouth daily.      . finasteride (PROSCAR) 5 MG tablet Take 5 mg by mouth daily.      . Multiple Vitamins-Minerals (CENTRUM SILVER ULTRA MENS PO) Take 1 tablet by mouth daily.        . simvastatin (ZOCOR) 10 MG tablet Take 20 mg by mouth at bedtime.        No current facility-administered medications for this visit.    Patient confirms/reports the following allergies:  No Known Allergies  No orders of the defined types were placed in this encounter.    AUTHORIZATION INFORMATION Primary Insurance:   ID #:   Group #:  Pre-Cert / Auth required: Pre-Cert / Auth #:   Secondary Insurance:   ID #:   Group #:  Pre-Cert / Auth required:  Pre-Cert / Auth #:   SCHEDULE INFORMATION: Procedure has been scheduled as follows:  Date:  09/04/2013            Time:  7:30 AM Location: [facility] Short Stay  This Gastroenterology Pre-Precedure Review Form is being routed to the following provider(s): R. Roetta Sessions, MD

## 2013-08-07 NOTE — Telephone Encounter (Signed)
Ok to schedule.

## 2013-08-09 MED ORDER — PEG-KCL-NACL-NASULF-NA ASC-C 100 G PO SOLR: 1.0000 | ORAL | Status: DC

## 2013-08-09 NOTE — Telephone Encounter (Signed)
Rx sent to the pharmacy and instructions mailed to pt.  

## 2013-08-20 ENCOUNTER — Telehealth: Payer: Self-pay

## 2013-08-20 NOTE — Telephone Encounter (Signed)
I called [employer] at [phone] and spoke to Desire R. Who said PA is not required for screening colonoscopy.

## 2013-08-21 ENCOUNTER — Encounter (HOSPITAL_COMMUNITY): Payer: Self-pay | Admitting: Pharmacy Technician

## 2013-09-04 ENCOUNTER — Ambulatory Visit (HOSPITAL_COMMUNITY)
Admission: RE | Admit: 2013-09-04 | Discharge: 2013-09-04 | Disposition: A | Discharge status: Home / Self Care | Payer: Medicare Other | Source: Ambulatory Visit | Attending: Internal Medicine | Admitting: Internal Medicine

## 2013-09-04 ENCOUNTER — Encounter (HOSPITAL_COMMUNITY)
Admission: RE | Disposition: A | Discharge status: Home / Self Care | Payer: Self-pay | Source: Ambulatory Visit | Attending: Internal Medicine

## 2013-09-04 ENCOUNTER — Encounter (HOSPITAL_COMMUNITY): Payer: Self-pay

## 2013-09-04 DIAGNOSIS — Z1211 Encounter for screening for malignant neoplasm of colon: Secondary | ICD-10-CM

## 2013-09-04 DIAGNOSIS — K5730 Diverticulosis of large intestine without perforation or abscess without bleeding: Secondary | ICD-10-CM

## 2013-09-04 DIAGNOSIS — I10 Essential (primary) hypertension: Secondary | ICD-10-CM | POA: Insufficient documentation

## 2013-09-04 DIAGNOSIS — K648 Other hemorrhoids: Secondary | ICD-10-CM | POA: Insufficient documentation

## 2013-09-04 HISTORY — PX: COLONOSCOPY: SHX5424

## 2013-09-04 SURGERY — COLONOSCOPY
Anesthesia: Moderate Sedation

## 2013-09-04 MED ORDER — ONDANSETRON HCL 4 MG/2ML IJ SOLN
INTRAMUSCULAR | Status: DC | PRN
  Administered 2013-09-04: 4 mg via INTRAVENOUS

## 2013-09-04 MED ORDER — MIDAZOLAM HCL 5 MG/5ML IJ SOLN
INTRAMUSCULAR | Status: AC
  Filled 2013-09-04: qty 10

## 2013-09-04 MED ORDER — ONDANSETRON HCL 4 MG/2ML IJ SOLN
INTRAMUSCULAR | Status: AC
  Filled 2013-09-04: qty 2

## 2013-09-04 MED ORDER — MEPERIDINE HCL 100 MG/ML IJ SOLN
INTRAMUSCULAR | Status: DC | PRN
  Administered 2013-09-04: 25 mg via INTRAVENOUS
  Administered 2013-09-04: 50 mg via INTRAVENOUS

## 2013-09-04 MED ORDER — MEPERIDINE HCL 100 MG/ML IJ SOLN
INTRAMUSCULAR | Status: AC
  Filled 2013-09-04: qty 2

## 2013-09-04 MED ORDER — SODIUM CHLORIDE 0.9 % IV SOLN
INTRAVENOUS | Status: DC
  Administered 2013-09-04: 08:00:00 via INTRAVENOUS

## 2013-09-04 MED ORDER — STERILE WATER FOR IRRIGATION IR SOLN
Status: DC | PRN
  Administered 2013-09-04: 09:00:00

## 2013-09-04 MED ORDER — MIDAZOLAM HCL 5 MG/5ML IJ SOLN
INTRAMUSCULAR | Status: DC | PRN
  Administered 2013-09-04: 2 mg via INTRAVENOUS
  Administered 2013-09-04 (×2): 1 mg via INTRAVENOUS

## 2013-09-04 NOTE — H&P (Signed)
Primary Care Physician:  Kirk Ruths, MD Primary Gastroenterologist:  Dr. Jena Gauss  Pre-Procedure History & Physical: HPI:  Russell Bryant is a 72 y.o. male is here for a screening colonoscopy. Last exam 10 years ago-negative. No bowel symptoms. No family history of colon polyps or colon cancer.  Past Medical History  Diagnosis Date  . Pancreatitis   . Hernia   . Hypertension   . Hypercholesteremia   . H/O hiatal hernia     Past Surgical History  Procedure Laterality Date  . Rotator cuff repair      bilateral-[facility]  . Cataract extraction w/phaco  11/05/2012    Procedure: CATARACT EXTRACTION PHACO AND INTRAOCULAR LENS PLACEMENT (IOC);  Surgeon: Gemma Payor, MD;  Location: [facility];  Service: Ophthalmology;  Laterality: Left;  CDE 18.11  . Cataract extraction w/phaco  11/19/2012    Procedure: CATARACT EXTRACTION PHACO AND INTRAOCULAR LENS PLACEMENT (IOC);  Surgeon: Gemma Payor, MD;  Location: [facility];  Service: Ophthalmology;  Laterality: Right;  CDE:  19.10    Prior to Admission medications   Medication Sig Start Date End Date Taking? Authorizing Provider  amLODipine (NORVASC) 10 MG tablet Take 10 mg by mouth daily.   Yes Historical Provider, MD  aspirin EC 81 MG tablet Take 81 mg by mouth daily.     Yes Historical Provider, MD  benazepril (LOTENSIN) 20 MG tablet Take 20 mg by mouth daily.   Yes Historical Provider, MD  finasteride (PROSCAR) 5 MG tablet Take 5 mg by mouth daily.   Yes Historical Provider, MD  Multiple Vitamins-Minerals (CENTRUM SILVER ULTRA MENS PO) Take 1 tablet by mouth daily.     Yes Historical Provider, MD  peg 3350 powder (MOVIPREP) 100 G SOLR Take 1 kit (200 g total) by mouth as directed. 08/09/13  Yes Corbin Ade, MD  simvastatin (ZOCOR) 20 MG tablet Take 20 mg by mouth every evening.   Yes Historical Provider, MD    Allergies as of 08/06/2013  . (No Known Allergies)    History reviewed. No pertinent family history.  History   Social History  .  Marital Status: Married    Spouse Name: N/A    Number of Children: N/A  . Years of Education: N/A   Occupational History  . Not on file.   Social History Main Topics  . Smoking status: Never Smoker   . Smokeless tobacco: Not on file  . Alcohol Use: No  . Drug Use: No  . Sexual Activity: Yes    Birth Control/ Protection: None   Other Topics Concern  . Not on file   Social History Narrative  . No narrative on file    Review of Systems: See HPI, otherwise negative ROS  Physical Exam: BP 142/81  Pulse 64  Temp(Src) 97.9 F (36.6 C) (Oral)  Resp 12  Ht 5\' 8"  (1.727 m)  Wt 163 lb (73.936 kg)  BMI 24.79 kg/m2  SpO2 96% General:   Alert,  Well-developed, well-nourished, pleasant and cooperative in NAD Head:  Normocephalic and atraumatic. Eyes:  Sclera clear, no icterus.   Conjunctiva pink. Ears:  Normal auditory acuity. Nose:  No deformity, discharge,  or lesions. Mouth:  No deformity or lesions, dentition normal. Neck:  Supple; no masses or thyromegaly. Lungs:  Clear throughout to auscultation.   No wheezes, crackles, or rhonchi. No acute distress. Heart:  Regular rate and rhythm; no murmurs, clicks, rubs,  or gallops. Abdomen:  Soft, nontender and nondistended. No masses, hepatosplenomegaly or hernias  noted. Normal bowel sounds, without guarding, and without rebound.   Msk:  Symmetrical without gross deformities. Normal posture. Pulses:  Normal pulses noted. Extremities:  Without clubbing or edema. Neurologic:  Alert and  oriented x4;  grossly normal neurologically. Skin:  Intact without significant lesions or rashes. Cervical Nodes:  No significant cervical adenopathy. Psych:  Alert and cooperative. Normal mood and affect.  Impression/Plan: Russell Bryant is now here to undergo a screening colonoscopy.  Average risk screening examination.  Risks, benefits, limitations, imponderables and alternatives regarding colonoscopy have been reviewed with the patient.  Questions have been answered. All parties agreeable.

## 2013-09-04 NOTE — Op Note (Signed)
[facility] [address] [place], [address]   COLONOSCOPY PROCEDURE REPORT  PATIENT: Russell Bryant, Russell Bryant  MR#:         [id number] BIRTHDATE: 19-Jan-1941 , 72  yrs. old GENDER: Male ENDOSCOPIST: R.  Roetta Sessions, MD FACP FACG REFERRED BY:  Karleen Hampshire, M.D. PROCEDURE DATE:  09/04/2013 PROCEDURE:     Ileocolonoscopy -  screening  INDICATIONS: Average risk colorectal cancer screening examination  INFORMED CONSENT:  The risks, benefits, alternatives and imponderables including but not limited to bleeding, perforation as well as the possibility of a missed lesion have been reviewed.  The potential for biopsy, lesion removal, etc. have also been discussed.  Questions have been answered.  All parties agreeable. Please see the history and physical in the medical record for more information.  MEDICATIONS: Versed 4 mg IV and Demerol 75 mg IV in divided doses. Zofran 4 mg IV  DESCRIPTION OF PROCEDURE:  After a digital rectal exam was performed, the EC-[id number] ([id number])  colonoscope was advanced from the anus through the rectum and colon to the area of the cecum, ileocecal valve and appendiceal orifice.  The cecum was deeply intubated.  These structures were well-seen and photographed for the record.  From the level of the cecum and ileocecal valve, the scope was slowly and cautiously withdrawn.  The mucosal surfaces were carefully surveyed utilizing scope tip deflection to facilitate fold flattening as needed.  The scope was pulled down into the rectum where a thorough examination including retroflexion was performed.    FINDINGS:  Minimal internal hemorrhoids; otherwise normal rectum. Scattered pancolonic diverticula; otherwise, the remainder of the colonic mucosa appears normal. The distal 5 cm of terminal ileal mucosa also appears normal.  THERAPEUTIC / DIAGNOSTIC MANEUVERS PERFORMED:  None  COMPLICATIONS: none  CECAL WITHDRAWAL TIME:  9  minutes  IMPRESSION:  Colonic diverticulosis  RECOMMENDATIONS:  I do not necessarily recommend any future screening examinations given patient's age and serial negative colonoscopies. Of course, should the patient develop any future GI symptoms he is to report them immediately.   _______________________________ eSigned:  R. Roetta Sessions, MD FACP [facility] 09/04/2013 9:16 AM   CC:    PATIENT NAME:  Russell Bryant, Russell Bryant MR#: [id number]

## 2013-09-06 ENCOUNTER — Encounter (HOSPITAL_COMMUNITY): Payer: Self-pay | Admitting: Internal Medicine

## 2014-08-21 ENCOUNTER — Other Ambulatory Visit: Payer: Self-pay | Admitting: Orthopedic Surgery

## 2014-08-22 ENCOUNTER — Encounter (HOSPITAL_BASED_OUTPATIENT_CLINIC_OR_DEPARTMENT_OTHER): Payer: Self-pay | Admitting: *Deleted

## 2014-08-22 NOTE — Progress Notes (Signed)
Pt will go to [facility] for ekg-bmet

## 2014-08-26 ENCOUNTER — Encounter (HOSPITAL_COMMUNITY)
Admission: RE | Admit: 2014-08-26 | Discharge: 2014-08-26 | Disposition: A | Discharge status: Home / Self Care | Payer: Medicare Other | Source: Ambulatory Visit | Attending: Orthopedic Surgery | Admitting: Orthopedic Surgery

## 2014-08-26 DIAGNOSIS — Z85820 Personal history of malignant melanoma of skin: Secondary | ICD-10-CM | POA: Diagnosis not present

## 2014-08-26 DIAGNOSIS — M19049 Primary osteoarthritis, unspecified hand: Secondary | ICD-10-CM | POA: Diagnosis not present

## 2014-08-26 DIAGNOSIS — E7800 Pure hypercholesterolemia, unspecified: Secondary | ICD-10-CM | POA: Diagnosis not present

## 2014-08-26 DIAGNOSIS — Z79899 Other long term (current) drug therapy: Secondary | ICD-10-CM | POA: Diagnosis not present

## 2014-08-26 DIAGNOSIS — Z7982 Long term (current) use of aspirin: Secondary | ICD-10-CM | POA: Diagnosis not present

## 2014-08-26 DIAGNOSIS — L723 Sebaceous cyst: Secondary | ICD-10-CM | POA: Diagnosis not present

## 2014-08-26 DIAGNOSIS — R29898 Other symptoms and signs involving the musculoskeletal system: Secondary | ICD-10-CM | POA: Diagnosis present

## 2014-08-26 DIAGNOSIS — I10 Essential (primary) hypertension: Secondary | ICD-10-CM | POA: Diagnosis not present

## 2014-08-26 LAB — BASIC METABOLIC PANEL
Anion gap: 10 (ref 5–15)
BUN: 14 mg/dL (ref 6–23)
CHLORIDE: 101 meq/L (ref 96–112)
CO2: 29 mEq/L (ref 19–32)
Calcium: 9.4 mg/dL (ref 8.4–10.5)
Creatinine, Ser: 1.05 mg/dL (ref 0.50–1.35)
GFR calc non Af Amer: 67 mL/min — ABNORMAL LOW (ref >90)
GFR, EST AFRICAN AMERICAN: 78 mL/min — AB (ref >90)
GLUCOSE: 72 mg/dL (ref 70–99)
POTASSIUM: 4.7 meq/L (ref 3.7–5.3)
SODIUM: 140 meq/L (ref 137–147)

## 2014-08-28 ENCOUNTER — Ambulatory Visit (HOSPITAL_BASED_OUTPATIENT_CLINIC_OR_DEPARTMENT_OTHER): Payer: Medicare Other | Admitting: Anesthesiology

## 2014-08-28 ENCOUNTER — Encounter (HOSPITAL_BASED_OUTPATIENT_CLINIC_OR_DEPARTMENT_OTHER)
Admission: RE | Disposition: A | Discharge status: Home / Self Care | Payer: Self-pay | Source: Ambulatory Visit | Attending: Orthopedic Surgery

## 2014-08-28 ENCOUNTER — Encounter (HOSPITAL_BASED_OUTPATIENT_CLINIC_OR_DEPARTMENT_OTHER): Payer: Medicare Other | Admitting: Anesthesiology

## 2014-08-28 ENCOUNTER — Encounter (HOSPITAL_BASED_OUTPATIENT_CLINIC_OR_DEPARTMENT_OTHER): Payer: Self-pay | Admitting: Orthopedic Surgery

## 2014-08-28 ENCOUNTER — Ambulatory Visit (HOSPITAL_BASED_OUTPATIENT_CLINIC_OR_DEPARTMENT_OTHER)
Admission: RE | Admit: 2014-08-28 | Discharge: 2014-08-28 | Disposition: A | Discharge status: Home / Self Care | Payer: Medicare Other | Source: Ambulatory Visit | Attending: Orthopedic Surgery | Admitting: Orthopedic Surgery

## 2014-08-28 DIAGNOSIS — M19049 Primary osteoarthritis, unspecified hand: Secondary | ICD-10-CM | POA: Diagnosis not present

## 2014-08-28 DIAGNOSIS — E7800 Pure hypercholesterolemia, unspecified: Secondary | ICD-10-CM | POA: Diagnosis not present

## 2014-08-28 DIAGNOSIS — L723 Sebaceous cyst: Secondary | ICD-10-CM | POA: Insufficient documentation

## 2014-08-28 DIAGNOSIS — Z79899 Other long term (current) drug therapy: Secondary | ICD-10-CM | POA: Insufficient documentation

## 2014-08-28 DIAGNOSIS — I10 Essential (primary) hypertension: Secondary | ICD-10-CM | POA: Diagnosis not present

## 2014-08-28 DIAGNOSIS — Z85820 Personal history of malignant melanoma of skin: Secondary | ICD-10-CM | POA: Insufficient documentation

## 2014-08-28 DIAGNOSIS — Z7982 Long term (current) use of aspirin: Secondary | ICD-10-CM | POA: Insufficient documentation

## 2014-08-28 HISTORY — PX: MASS EXCISION: SHX2000

## 2014-08-28 HISTORY — DX: Unspecified osteoarthritis, unspecified site: M19.90

## 2014-08-28 LAB — POCT HEMOGLOBIN-HEMACUE: Hemoglobin: 16.3 g/dL (ref 13.0–17.0)

## 2014-08-28 SURGERY — EXCISION MASS
Anesthesia: Regional | Site: Finger | Laterality: Left

## 2014-08-28 MED ORDER — HYDROMORPHONE HCL PF 1 MG/ML IJ SOLN: 0.2500 mg | INTRAMUSCULAR | Status: DC | PRN

## 2014-08-28 MED ORDER — ONDANSETRON HCL 4 MG/2ML IJ SOLN
INTRAMUSCULAR | Status: DC | PRN
  Administered 2014-08-28: 4 mg via INTRAVENOUS

## 2014-08-28 MED ORDER — CEFAZOLIN SODIUM-DEXTROSE 2-3 GM-% IV SOLR
2.0000 g | INTRAVENOUS | Status: AC
  Administered 2014-08-28: 2 g via INTRAVENOUS

## 2014-08-28 MED ORDER — MEPERIDINE HCL 25 MG/ML IJ SOLN
6.2500 mg | INTRAMUSCULAR | Status: DC | PRN
Start: 2014-08-28 — End: 2014-08-28

## 2014-08-28 MED ORDER — OXYCODONE HCL 5 MG PO TABS: 5.0000 mg | ORAL_TABLET | Freq: Once | ORAL | Status: DC | PRN

## 2014-08-28 MED ORDER — FENTANYL CITRATE 0.05 MG/ML IJ SOLN: 50.0000 ug | INTRAMUSCULAR | Status: DC | PRN

## 2014-08-28 MED ORDER — BUPIVACAINE HCL (PF) 0.25 % IJ SOLN
INTRAMUSCULAR | Status: DC | PRN
  Administered 2014-08-28: 8 mL

## 2014-08-28 MED ORDER — CHLORHEXIDINE GLUCONATE 4 % EX LIQD: 60.0000 mL | Freq: Once | CUTANEOUS | Status: DC

## 2014-08-28 MED ORDER — FENTANYL CITRATE 0.05 MG/ML IJ SOLN
INTRAMUSCULAR | Status: DC | PRN
  Administered 2014-08-28 (×3): 50 ug via INTRAVENOUS

## 2014-08-28 MED ORDER — HYDROCODONE-ACETAMINOPHEN 5-325 MG PO TABS
1.0000 | ORAL_TABLET | Freq: Four times a day (QID) | ORAL | Status: DC | PRN
Start: 2014-08-28 — End: 2014-12-31

## 2014-08-28 MED ORDER — BUPIVACAINE HCL (PF) 0.25 % IJ SOLN
INTRAMUSCULAR | Status: AC
  Filled 2014-08-28: qty 30

## 2014-08-28 MED ORDER — LACTATED RINGERS IV SOLN
INTRAVENOUS | Status: DC
  Administered 2014-08-28: 12:00:00 via INTRAVENOUS

## 2014-08-28 MED ORDER — FENTANYL CITRATE 0.05 MG/ML IJ SOLN
INTRAMUSCULAR | Status: AC
  Filled 2014-08-28: qty 4

## 2014-08-28 MED ORDER — ONDANSETRON HCL 4 MG/2ML IJ SOLN: 4.0000 mg | Freq: Once | INTRAMUSCULAR | Status: DC | PRN

## 2014-08-28 MED ORDER — CEFAZOLIN SODIUM-DEXTROSE 2-3 GM-% IV SOLR
INTRAVENOUS | Status: AC
  Filled 2014-08-28: qty 50

## 2014-08-28 MED ORDER — CEFAZOLIN SODIUM-DEXTROSE 2-3 GM-% IV SOLR: 2.0000 g | INTRAVENOUS | Status: DC

## 2014-08-28 MED ORDER — PROPOFOL INFUSION 10 MG/ML OPTIME
INTRAVENOUS | Status: DC | PRN
  Administered 2014-08-28: 100 ug/kg/min via INTRAVENOUS

## 2014-08-28 MED ORDER — LIDOCAINE HCL (CARDIAC) 20 MG/ML IV SOLN
INTRAVENOUS | Status: DC | PRN
  Administered 2014-08-28: 50 mg via INTRAVENOUS

## 2014-08-28 MED ORDER — OXYCODONE HCL 5 MG/5ML PO SOLN: 5.0000 mg | Freq: Once | ORAL | Status: DC | PRN

## 2014-08-28 MED ORDER — MIDAZOLAM HCL 2 MG/2ML IJ SOLN: 1.0000 mg | INTRAMUSCULAR | Status: DC | PRN

## 2014-08-28 SURGICAL SUPPLY — 52 items
BANDAGE COBAN STERILE 2 (GAUZE/BANDAGES/DRESSINGS) IMPLANT
BLADE MINI RND TIP GREEN BEAV (BLADE) IMPLANT
BLADE SURG 15 STRL LF DISP TIS (BLADE) ×1 IMPLANT
BLADE SURG 15 STRL SS (BLADE) ×2
BNDG COHESIVE 1X5 TAN STRL LF (GAUZE/BANDAGES/DRESSINGS) IMPLANT
BNDG COHESIVE 3X5 TAN STRL LF (GAUZE/BANDAGES/DRESSINGS) IMPLANT
BNDG ESMARK 4X9 LF (GAUZE/BANDAGES/DRESSINGS) IMPLANT
BNDG GAUZE ELAST 4 BULKY (GAUZE/BANDAGES/DRESSINGS) IMPLANT
CHLORAPREP W/TINT 26ML (MISCELLANEOUS) ×3 IMPLANT
CORDS BIPOLAR (ELECTRODE) ×3 IMPLANT
COVER MAYO STAND STRL (DRAPES) ×3 IMPLANT
COVER TABLE BACK 60X90 (DRAPES) ×3 IMPLANT
CUFF TOURNIQUET SINGLE 18IN (TOURNIQUET CUFF) IMPLANT
DECANTER SPIKE VIAL GLASS SM (MISCELLANEOUS) IMPLANT
DRAIN PENROSE 1/2X12 LTX STRL (WOUND CARE) IMPLANT
DRAPE EXTREMITY T 121X128X90 (DRAPE) ×3 IMPLANT
DRAPE SURG 17X23 STRL (DRAPES) ×3 IMPLANT
GAUZE SPONGE 4X4 12PLY STRL (GAUZE/BANDAGES/DRESSINGS) ×3 IMPLANT
GAUZE XEROFORM 1X8 LF (GAUZE/BANDAGES/DRESSINGS) ×3 IMPLANT
GLOVE BIO SURGEON STRL SZ 6.5 (GLOVE) ×2 IMPLANT
GLOVE BIO SURGEONS STRL SZ 6.5 (GLOVE) ×1
GLOVE BIOGEL PI IND STRL 7.0 (GLOVE) ×1 IMPLANT
GLOVE BIOGEL PI IND STRL 8.5 (GLOVE) ×1 IMPLANT
GLOVE BIOGEL PI INDICATOR 7.0 (GLOVE) ×2
GLOVE BIOGEL PI INDICATOR 8.5 (GLOVE) ×2
GLOVE SURG ORTHO 8.0 STRL STRW (GLOVE) ×3 IMPLANT
GOWN STRL REUS W/ TWL LRG LVL3 (GOWN DISPOSABLE) ×1 IMPLANT
GOWN STRL REUS W/TWL LRG LVL3 (GOWN DISPOSABLE) ×2
GOWN STRL REUS W/TWL XL LVL3 (GOWN DISPOSABLE) ×3 IMPLANT
NDL SAFETY ECLIPSE 18X1.5 (NEEDLE) IMPLANT
NEEDLE 27GAX1X1/2 (NEEDLE) ×3 IMPLANT
NEEDLE HYPO 18GX1.5 SHARP (NEEDLE)
NS IRRIG 1000ML POUR BTL (IV SOLUTION) ×3 IMPLANT
PACK BASIN DAY SURGERY FS (CUSTOM PROCEDURE TRAY) ×3 IMPLANT
PAD CAST 3X4 CTTN HI CHSV (CAST SUPPLIES) IMPLANT
PADDING CAST ABS 3INX4YD NS (CAST SUPPLIES)
PADDING CAST ABS 4INX4YD NS (CAST SUPPLIES) ×2
PADDING CAST ABS COTTON 3X4 (CAST SUPPLIES) IMPLANT
PADDING CAST ABS COTTON 4X4 ST (CAST SUPPLIES) ×1 IMPLANT
PADDING CAST COTTON 3X4 STRL (CAST SUPPLIES)
SPLINT FINGER 5/8X3.25 (SOFTGOODS) ×1 IMPLANT
SPLINT FINGER FOAM 3 9119 05 (SOFTGOODS) ×3
SPLINT PLASTER CAST XFAST 3X15 (CAST SUPPLIES) IMPLANT
SPLINT PLASTER XTRA FASTSET 3X (CAST SUPPLIES)
STOCKINETTE 4X48 STRL (DRAPES) ×3 IMPLANT
SUT VIC AB 4-0 P2 18 (SUTURE) IMPLANT
SUT VICRYL RAPID 5 0 P 3 (SUTURE) IMPLANT
SUT VICRYL RAPIDE 4/0 PS 2 (SUTURE) ×3 IMPLANT
SYR BULB 3OZ (MISCELLANEOUS) ×3 IMPLANT
SYR CONTROL 10ML LL (SYRINGE) ×3 IMPLANT
TOWEL OR 17X24 6PK STRL BLUE (TOWEL DISPOSABLE) ×3 IMPLANT
UNDERPAD 30X30 INCONTINENT (UNDERPADS AND DIAPERS) ×3 IMPLANT

## 2014-08-28 NOTE — Anesthesia Postprocedure Evaluation (Signed)
  Anesthesia Post-op Note  Patient: Russell Bryant  Procedure(s) Performed: Procedure(s): 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 (Left)  Patient Location: PACU  Anesthesia Type:MAC  Level of Consciousness: awake  Airway and Oxygen Therapy: Patient Spontanous Breathing  Post-op Pain: mild  Post-op Assessment: Post-op Vital signs reviewed  Post-op Vital Signs: Reviewed  Last Vitals:  Filed Vitals:   08/28/14 1315  BP: 130/71  Pulse: 60  Temp:   Resp: 13    Complications: No apparent anesthesia complications

## 2014-08-28 NOTE — Brief Op Note (Signed)
08/28/2014  12:45 PM  PATIENT:  Russell Bryant  73 y.o. male  PRE-OPERATIVE DIAGNOSIS:  mucoid tumor left middle finger  POST-OPERATIVE DIAGNOSIS:  mucoid tumor left middle finger  PROCEDURE:  Procedure(s): 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 (Left)  SURGEON:  Surgeon(s) and Role:    * Daryll Brod, MD - Primary  PHYSICIAN ASSISTANT:   ASSISTANTS: none   ANESTHESIA:   local and regional  EBL:  Total I/O In: 500 [I.V.:500] Out: -   BLOOD ADMINISTERED:none  DRAINS: none   LOCAL MEDICATIONS USED:  BUPIVICAINE   SPECIMEN:  Excision  DISPOSITION OF SPECIMEN:  PATHOLOGY  COUNTS:  YES  TOURNIQUET:   Total Tourniquet Time Documented: Forearm (Left) - -[id number] minutes Total: Forearm (Left) - -532992 minutes   DICTATION: .Other Dictation: Dictation Number [id number]  PLAN OF CARE: Discharge to home after PACU  PATIENT DISPOSITION:  PACU - hemodynamically stable.

## 2014-08-28 NOTE — H&P (Signed)
Russell Bryant is a 73 year old right hand dominant male with a mass on the dorsal aspect left middle finger DIP joint. He states he had this biopsied on 7-27 and it recurred almost immediately. He has no prior history of injury. He was told that it was a noncancerous mass that was fluid filled. He has a history of arthritis. He complains of intermittent, mild throbbing pain with a feeling of swelling. He states it is gradually getting better. Activity makes it worse.   PAST MEDICAL HISTORY: He has no known drug allergies. He is on BP pill and cholesterol medication. He has had a rotator cuff repair by Dr. Percell Miller in 2009.  FAMILY H ISTORY: Positive for arthritis, otherwise negative.  SOCIAL HISTORY: He does not smoke or drink. He is married and retired.  REVIEW OF SYSTEMS: Positive for high BP, otherwise negative for 14 points. Russell Bryant is an 73 y.o. male.   Chief Complaint: mucoid tumor left middle finger HPI: see above  Past Medical History  Diagnosis Date  . Pancreatitis   . Hernia   . Hypertension   . Hypercholesteremia   . H/O hiatal hernia   . Arthritis     Past Surgical History  Procedure Laterality Date  . Rotator cuff repair      bilateral-[facility]  . Cataract extraction w/phaco  11/05/2012    Procedure: CATARACT EXTRACTION PHACO AND INTRAOCULAR LENS PLACEMENT (IOC);  Surgeon: Tonny Branch, MD;  Location: [facility];  Service: Ophthalmology;  Laterality: Left;  CDE 18.11  . Cataract extraction w/phaco  11/19/2012    Procedure: CATARACT EXTRACTION PHACO AND INTRAOCULAR LENS PLACEMENT (IOC);  Surgeon: Tonny Branch, MD;  Location: [facility];  Service: Ophthalmology;  Laterality: Right;  CDE:  19.10  . Colonoscopy N/A 09/04/2013    Procedure: COLONOSCOPY;  Surgeon: Daneil Dolin, MD;  Location: [facility] END[facility] SUITE;  Service: Endoscopy;  Laterality: N/A;  8:30 AM  . Melanoma excision  2010    [facility]hard    History reviewed. No pertinent family history. Social History:  reports that he has  never smoked. He does not have any smokeless tobacco history on file. He reports that he does not drink alcohol or use illicit drugs.  Allergies: No Known Allergies  Medications Prior to Admission  Medication Sig Dispense Refill  . amLODipine (NORVASC) 10 MG tablet Take 10 mg by mouth daily.      Marland Kitchen aspirin EC 81 MG tablet Take 81 mg by mouth daily.        . benazepril (LOTENSIN) 20 MG tablet Take 20 mg by mouth daily.      . finasteride (PROSCAR) 5 MG tablet Take 5 mg by mouth daily.      . Multiple Vitamins-Minerals (CENTRUM SILVER ULTRA MENS PO) Take 1 tablet by mouth daily.        . simvastatin (ZOCOR) 20 MG tablet Take 20 mg by mouth every evening.        No results found for this or any previous visit (from the past 48 hour(s)).  No results found.   Pertinent items are noted in HPI.  Height 5\' 8"  (1.727 m), weight 73.936 kg (163 lb).  General appearance: alert, cooperative and appears stated age Head: Normocephalic, without obvious abnormality Neck: no JVD Resp: clear to auscultation bilaterally Cardio: regular rate and rhythm, S1, S2 normal, no murmur, click, rub or gallop GI: soft, non-tender; bowel sounds normal; no masses,  no organomegaly Extremities: mass left middle finger Pulses: 2+ and  symmetric Skin: Skin color, texture, turgor normal. No rashes or lesions Neurologic: Grossly normal Incision/Wound: na  Assessment/Plan X-rays reveal minimal degenerative changes.  He is also complaining of some pain in his right index.  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  We have discussed the possibility of surgical excision, debridement of the joint. The pre, peri and post op course are discussed along with risks and complications.  He is aware there is no guarantee with surgery, possibility of infection, recurrence, injury to arteries, nerves and tendons, incomplete relief of symptoms and dystrophy.  He would like to proceed. He is scheduled  for excision mucoid cyst with debridement of the DIP joint left middle finger as an outpatient under regional anesthesia. Questions were invited and answered to the patient's satisfaction.   Russell Bryant R 08/28/2014, 11:03 AM

## 2014-08-28 NOTE — Op Note (Signed)
Russell Bryant, Russell Bryant              ACCOUNT NO.:  [id number]  MEDICAL RECORD NO.:  [id number]  LOCATION:                                 FACILITY:  PHYSICIAN:  Daryll Brod, M.D.       DATE OF BIRTH:  1941-11-22  DATE OF PROCEDURE:  08/28/2014 DATE OF DISCHARGE:                              OPERATIVE REPORT   PREOPERATIVE DIAGNOSIS:  Mucoid tumor, distal interphalangeal joint, left middle finger with degenerative arthritis, distal interphalangeal joint.  POSTOPERATIVE DIAGNOSIS:  Mucoid tumor, distal interphalangeal joint, left middle finger with degenerative arthritis, distal interphalangeal joint.  OPERATION:  Excision of cyst debridement of distal interphalangeal joint, left middle finger.  SURGEON:  Daryll Brod, M.D.  ANESTHESIA:  IV regional with metacarpal block.  ANESTHESIOLOGIST:  Dr. Glennon Mac.  HISTORY:  The patient is a 73 year old male with a history of a mucoid cyst over the distal interphalangeal joint, left middle finger.  He is desirous having this excised.  Pre, peri, and postoperative course have been discussed along with risks and complications.  He is aware that there is no guarantee with the surgery possibility of infection; recurrence of injury to arteries, nerves, tendons, incomplete relief of symptoms, dystrophy.  In preoperative area, the patient is seen, the extremity marked by both patient and surgeon.  Antibiotic given.  PROCEDURE IN DETAIL:  The patient was brought to the operating room, where a forearm-based IV regional anesthetic was carried out without difficulty.  He was prepped using ChloraPrep, supine position, left arm free.  A 3-minute dry time was allowed.  Time-out taken, confirming the patient and procedure.  Curvilinear incision was made over the distal interphalangeal joint, left middle finger carried down through subcutaneous tissue.  Bleeders were electrocauterized with bipolar.  A dissection carried underneath the skin to the level  of the cyst was partially removed with a small rongeur.  The joint was opened on its ulnar aspect.  This allowed the use of a hemostat type rongeur for excision of the osteophytes and a synovectomy of the joint.  The specimen was sent to Pathology.  Joint was copiously irrigated with saline.  The skin was closed with interrupted 4-0 Vicryl Rapide sutures. A metacarpal block was then given with 0.25% Marcaine without epinephrine.  Bupivacaine approximately 8 mL was used. Sterile compressive dressing and splint to the finger was applied.  The patient tolerated the procedure well and was taken to the recovery room for observation in satisfactory condition.  On deflation of the tourniquet, all remaining fingers pinked.  He will be discharged on Norco and to return at the [facility] in 1 week.          ______________________________ Daryll Brod, M.D.     GK/MEDQ  D:  08/28/2014  T:  08/28/2014  Job:  [id number]

## 2014-08-28 NOTE — Op Note (Signed)
Dictation Number [id number]

## 2014-08-28 NOTE — Discharge Instructions (Addendum)

## 2014-08-28 NOTE — Transfer of Care (Signed)
Immediate Anesthesia Transfer of Care Note  Patient: Russell Bryant  Procedure(s) Performed: Procedure(s): 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 (Left)  Patient Location: PACU  Anesthesia Type:MAC and Bier block  Level of Consciousness: awake and alert   Airway & Oxygen Therapy: Patient Spontanous Breathing and Patient connected to face mask oxygen  Post-op Assessment: Report given to PACU RN and Post -op Vital signs reviewed and stable  Post vital signs: Reviewed and stable  Complications: No apparent anesthesia complications

## 2014-08-28 NOTE — Anesthesia Procedure Notes (Signed)
Anesthesia Regional Block:  Bier block (IV Regional)  Pre-Anesthetic Checklist: ,, timeout performed, Correct Patient, Correct Site, Correct Laterality, Correct Procedure,, site marked, surgical consent,, at surgeon's request Needles:  Injection technique: Single-shot  Needle Type: Other      Needle Gauge: 22 and 22 G    Additional Needles: Bier block (IV Regional) Narrative:  Start time: 08/28/2014 12:22 PM End time: 08/28/2014 12:23 PM Injection made incrementally with aspirations every 35 mL.  Performed by: Personally   Additional Notes: Esmark wrap, torq up to 318mmhg, neg pulse, injected 35 ml of pres free 0.5% lido, pt tol well

## 2014-08-28 NOTE — Anesthesia Preprocedure Evaluation (Addendum)
Anesthesia Evaluation  Patient identified by MRN, date of birth, ID band Patient awake    Reviewed: Allergy & Precautions, H&P , NPO status , Patient's Chart, lab work & pertinent test results  Airway Mallampati: I TM Distance: >3 FB Neck ROM: Full    Dental   Pulmonary          Cardiovascular hypertension, Pt. on medications     Neuro/Psych    GI/Hepatic   Endo/Other    Renal/GU      Musculoskeletal   Abdominal   Peds  Hematology   Anesthesia Other Findings   Reproductive/Obstetrics                        Anesthesia Physical Anesthesia Plan  ASA: II  Anesthesia Plan: Bier Block   Post-op Pain Management:    Induction: Intravenous  Airway Management Planned: Simple Face Mask  Additional Equipment:   Intra-op Plan:   Post-operative Plan:   Informed Consent: I have reviewed the patients History and Physical, chart, labs and discussed the procedure including the risks, benefits and alternatives for the proposed anesthesia with the patient or authorized representative who has indicated his/her understanding and acceptance.     Plan Discussed with: CRNA and Surgeon  Anesthesia Plan Comments:         Anesthesia Quick Evaluation

## 2014-08-29 ENCOUNTER — Encounter (HOSPITAL_BASED_OUTPATIENT_CLINIC_OR_DEPARTMENT_OTHER): Payer: Self-pay | Admitting: Orthopedic Surgery

## 2014-12-31 ENCOUNTER — Emergency Department (HOSPITAL_COMMUNITY): Payer: Medicare Other

## 2014-12-31 ENCOUNTER — Inpatient Hospital Stay (HOSPITAL_COMMUNITY)
Admission: EM | Admit: 2014-12-31 | Discharge: 2015-01-03 | DRG: 440 | Disposition: A | Discharge status: Home / Self Care | Payer: Medicare Other | Attending: Internal Medicine | Admitting: Internal Medicine

## 2014-12-31 ENCOUNTER — Ambulatory Visit (INDEPENDENT_AMBULATORY_CARE_PROVIDER_SITE_OTHER): Payer: Medicare Other | Admitting: Nurse Practitioner

## 2014-12-31 ENCOUNTER — Encounter: Payer: Self-pay | Admitting: Nurse Practitioner

## 2014-12-31 ENCOUNTER — Telehealth: Payer: Self-pay | Admitting: Internal Medicine

## 2014-12-31 ENCOUNTER — Encounter (HOSPITAL_COMMUNITY): Payer: Self-pay | Admitting: Emergency Medicine

## 2014-12-31 VITALS — BP 156/76 | HR 82 | Temp 98.6°F | Ht 68.0 in | Wt 167.0 lb

## 2014-12-31 DIAGNOSIS — Z8719 Personal history of other diseases of the digestive system: Secondary | ICD-10-CM | POA: Diagnosis not present

## 2014-12-31 DIAGNOSIS — E876 Hypokalemia: Secondary | ICD-10-CM | POA: Diagnosis present

## 2014-12-31 DIAGNOSIS — R1110 Vomiting, unspecified: Secondary | ICD-10-CM | POA: Diagnosis not present

## 2014-12-31 DIAGNOSIS — E785 Hyperlipidemia, unspecified: Secondary | ICD-10-CM | POA: Diagnosis present

## 2014-12-31 DIAGNOSIS — Z7982 Long term (current) use of aspirin: Secondary | ICD-10-CM | POA: Diagnosis not present

## 2014-12-31 DIAGNOSIS — K859 Acute pancreatitis without necrosis or infection, unspecified: Secondary | ICD-10-CM | POA: Diagnosis present

## 2014-12-31 DIAGNOSIS — I10 Essential (primary) hypertension: Secondary | ICD-10-CM | POA: Diagnosis not present

## 2014-12-31 DIAGNOSIS — K850 Idiopathic acute pancreatitis: Secondary | ICD-10-CM | POA: Diagnosis not present

## 2014-12-31 DIAGNOSIS — D72829 Elevated white blood cell count, unspecified: Secondary | ICD-10-CM | POA: Diagnosis not present

## 2014-12-31 DIAGNOSIS — E780 Pure hypercholesterolemia: Secondary | ICD-10-CM | POA: Diagnosis not present

## 2014-12-31 DIAGNOSIS — R1013 Epigastric pain: Secondary | ICD-10-CM | POA: Diagnosis not present

## 2014-12-31 DIAGNOSIS — K868 Other specified diseases of pancreas: Secondary | ICD-10-CM | POA: Diagnosis not present

## 2014-12-31 DIAGNOSIS — R112 Nausea with vomiting, unspecified: Secondary | ICD-10-CM | POA: Insufficient documentation

## 2014-12-31 DIAGNOSIS — N400 Enlarged prostate without lower urinary tract symptoms: Secondary | ICD-10-CM | POA: Diagnosis present

## 2014-12-31 DIAGNOSIS — M1990 Unspecified osteoarthritis, unspecified site: Secondary | ICD-10-CM | POA: Diagnosis present

## 2014-12-31 DIAGNOSIS — R110 Nausea: Secondary | ICD-10-CM | POA: Diagnosis not present

## 2014-12-31 DIAGNOSIS — I700 Atherosclerosis of aorta: Secondary | ICD-10-CM | POA: Diagnosis not present

## 2014-12-31 DIAGNOSIS — I77819 Aortic ectasia, unspecified site: Secondary | ICD-10-CM | POA: Diagnosis not present

## 2014-12-31 LAB — URINALYSIS, ROUTINE W REFLEX MICROSCOPIC
Glucose, UA: NEGATIVE mg/dL
Ketones, ur: 40 mg/dL — AB
LEUKOCYTES UA: NEGATIVE
Nitrite: NEGATIVE
PROTEIN: 100 mg/dL — AB
Specific Gravity, Urine: >1.03 — ABNORMAL HIGH (ref 1.005–1.030)
UROBILINOGEN UA: 0.2 mg/dL (ref 0.0–1.0)
pH: 5.5 (ref 5.0–8.0)

## 2014-12-31 LAB — COMPREHENSIVE METABOLIC PANEL
ALK PHOS: 51 U/L (ref 39–117)
ALT: 20 U/L (ref 0–53)
AST: 32 U/L (ref 0–37)
Albumin: 4.5 g/dL (ref 3.5–5.2)
Anion gap: 10 (ref 5–15)
BILIRUBIN TOTAL: 2.5 mg/dL — AB (ref 0.3–1.2)
BUN: 16 mg/dL (ref 6–23)
CHLORIDE: 102 meq/L (ref 96–112)
CO2: 25 mmol/L (ref 19–32)
Calcium: 9.2 mg/dL (ref 8.4–10.5)
Creatinine, Ser: 1.06 mg/dL (ref 0.50–1.35)
GFR calc Af Amer: 77 mL/min — ABNORMAL LOW (ref >90)
GFR, EST NON AFRICAN AMERICAN: 67 mL/min — AB (ref >90)
Glucose, Bld: 86 mg/dL (ref 70–99)
POTASSIUM: 3.8 mmol/L (ref 3.5–5.1)
Sodium: 137 mmol/L (ref 135–145)
Total Protein: 7.7 g/dL (ref 6.0–8.3)

## 2014-12-31 LAB — URINE MICROSCOPIC-ADD ON

## 2014-12-31 LAB — CBC WITH DIFFERENTIAL/PLATELET
Basophils Absolute: 0 10*3/uL (ref 0.0–0.1)
Basophils Relative: 0 % (ref 0–1)
EOS ABS: 0 10*3/uL (ref 0.0–0.7)
EOS PCT: 0 % (ref 0–5)
HEMATOCRIT: 44.7 % (ref 39.0–52.0)
Hemoglobin: 15.2 g/dL (ref 13.0–17.0)
Lymphocytes Relative: 4 % — ABNORMAL LOW (ref 12–46)
Lymphs Abs: 0.8 10*3/uL (ref 0.7–4.0)
MCH: 31.1 pg (ref 26.0–34.0)
MCHC: 34 g/dL (ref 30.0–36.0)
MCV: 91.6 fL (ref 78.0–100.0)
Monocytes Absolute: 1.9 10*3/uL — ABNORMAL HIGH (ref 0.1–1.0)
Monocytes Relative: 11 % (ref 3–12)
Neutro Abs: 15.1 10*3/uL — ABNORMAL HIGH (ref 1.7–7.7)
Neutrophils Relative %: 85 % — ABNORMAL HIGH (ref 43–77)
PLATELETS: 208 10*3/uL (ref 150–400)
RBC: 4.88 MIL/uL (ref 4.22–5.81)
RDW: 13.1 % (ref 11.5–15.5)
WBC: 17.8 10*3/uL — AB (ref 4.0–10.5)

## 2014-12-31 LAB — LIPASE, BLOOD: LIPASE: 2608 U/L — AB (ref 11–59)

## 2014-12-31 MED ORDER — FINASTERIDE 5 MG PO TABS
5.0000 mg | ORAL_TABLET | Freq: Every day | ORAL | Status: DC
  Administered 2015-01-01 – 2015-01-03 (×3): 5 mg via ORAL
  Filled 2014-12-31 (×5): qty 1

## 2014-12-31 MED ORDER — ONDANSETRON HCL 4 MG/2ML IJ SOLN
4.0000 mg | Freq: Once | INTRAMUSCULAR | Status: AC
  Administered 2014-12-31: 4 mg via INTRAVENOUS
  Filled 2014-12-31: qty 2

## 2014-12-31 MED ORDER — ONDANSETRON HCL 4 MG/2ML IJ SOLN: 4.0000 mg | Freq: Four times a day (QID) | INTRAMUSCULAR | Status: DC | PRN

## 2014-12-31 MED ORDER — IOHEXOL 300 MG/ML  SOLN
25.0000 mL | Freq: Once | INTRAMUSCULAR | Status: AC | PRN
  Administered 2014-12-31: 25 mL via ORAL

## 2014-12-31 MED ORDER — SODIUM CHLORIDE 0.9 % IV SOLN
INTRAVENOUS | Status: DC
  Administered 2014-12-31: 22:00:00 via INTRAVENOUS

## 2014-12-31 MED ORDER — MORPHINE SULFATE 4 MG/ML IJ SOLN
4.0000 mg | INTRAMUSCULAR | Status: DC | PRN
  Administered 2014-12-31 – 2015-01-01 (×4): 4 mg via INTRAVENOUS
  Filled 2014-12-31 (×4): qty 1

## 2014-12-31 MED ORDER — SODIUM CHLORIDE 0.9 % IV BOLUS (SEPSIS)
500.0000 mL | Freq: Once | INTRAVENOUS | Status: AC
  Administered 2014-12-31: 500 mL via INTRAVENOUS

## 2014-12-31 MED ORDER — MORPHINE SULFATE 4 MG/ML IJ SOLN
4.0000 mg | Freq: Once | INTRAMUSCULAR | Status: AC
  Administered 2014-12-31: 4 mg via INTRAVENOUS
  Filled 2014-12-31: qty 1

## 2014-12-31 MED ORDER — BENAZEPRIL HCL 10 MG PO TABS
20.0000 mg | ORAL_TABLET | Freq: Every day | ORAL | Status: DC
  Administered 2014-12-31: 20 mg via ORAL
  Filled 2014-12-31: qty 1
  Filled 2014-12-31: qty 2
  Filled 2014-12-31 (×2): qty 1

## 2014-12-31 MED ORDER — ONDANSETRON HCL 4 MG PO TABS: 4.0000 mg | ORAL_TABLET | Freq: Four times a day (QID) | ORAL | Status: DC | PRN

## 2014-12-31 MED ORDER — ASPIRIN EC 81 MG PO TBEC
81.0000 mg | DELAYED_RELEASE_TABLET | Freq: Every day | ORAL | Status: DC
  Administered 2015-01-01 – 2015-01-03 (×3): 81 mg via ORAL
  Filled 2014-12-31 (×3): qty 1

## 2014-12-31 MED ORDER — HEPARIN SODIUM (PORCINE) 5000 UNIT/ML IJ SOLN
5000.0000 [IU] | Freq: Three times a day (TID) | INTRAMUSCULAR | Status: DC
  Administered 2014-12-31 – 2015-01-03 (×9): 5000 [IU] via SUBCUTANEOUS
  Filled 2014-12-31 (×8): qty 1

## 2014-12-31 MED ORDER — SIMVASTATIN 20 MG PO TABS
20.0000 mg | ORAL_TABLET | Freq: Every evening | ORAL | Status: DC
  Administered 2014-12-31: 20 mg via ORAL
  Filled 2014-12-31: qty 1

## 2014-12-31 MED ORDER — SODIUM CHLORIDE 0.9 % IV SOLN
Freq: Once | INTRAVENOUS | Status: AC
  Administered 2014-12-31: 15:00:00 via INTRAVENOUS

## 2014-12-31 MED ORDER — SIMVASTATIN 20 MG PO TABS: 20.0000 mg | ORAL_TABLET | Freq: Every evening | ORAL | Status: DC

## 2014-12-31 MED ORDER — IOHEXOL 300 MG/ML  SOLN
100.0000 mL | Freq: Once | INTRAMUSCULAR | Status: AC | PRN
  Administered 2014-12-31: 100 mL via INTRAVENOUS

## 2014-12-31 MED ORDER — AMLODIPINE BESYLATE 5 MG PO TABS
10.0000 mg | ORAL_TABLET | Freq: Every day | ORAL | Status: DC
  Administered 2014-12-31: 10 mg via ORAL
  Filled 2014-12-31: qty 2

## 2014-12-31 NOTE — Assessment & Plan Note (Signed)
Onset of nausea and dry heaves since last night after 2-3 days of epigastric pain and history of pancreatitis. No appetitive, unable to eat anything po at this point. In combination with epigastric pain and guarding concerned for acute pancreatitis flare.  Discussed with Dr. Oneida Alar and agreed on sending patient to the ER for further evaluation and workup.

## 2014-12-31 NOTE — Patient Instructions (Signed)
1. Proceed to the [facility] ER. We have called and notified them that you are coming.

## 2014-12-31 NOTE — Telephone Encounter (Signed)
Noted. Patient seen in office and referred to ED for further evaluation. See notes for details.

## 2014-12-31 NOTE — H&P (Signed)
[facility] History and Physical  Russell Bryant HBZ:[id number] DOB: May 20, 1941 DOA: 12/31/2014  Referring physician: D Oasis PCP: Rocky Morel, MD   Chief Complaint: abd pain.   HPI: Russell Bryant is a 74 y.o. male  abd pain. Started 3 days ago. Associated w/ chills, dry heaving, anorexia, and subjective fevers. tums w/o benefit. Epigastric w/o radiation. Pt seen at Dr Rourke's office by the PA and told to come to the emergency room. 20 years ago pt experienced pancreatitis flare. Denies ETOH use. Getting worse.   Review of Systems:  Constitutional:  No weight loss, night sweats, chills, fatigue.  HEENT:  No headaches, Difficulty swallowing,Tooth/dental problems,Sore throat,  No sneezing, itching, ear ache, nasal congestion, post nasal drip,  Cardio-vascular:  No chest pain, Orthopnea, PND, swelling in lower extremities, anasarca, dizziness, palpitations  GI:  Per hpi Resp:  No shortness of breath with exertion or at rest. No excess mucus, no productive cough, No non-productive cough, No coughing up of blood.No change in color of mucus.No wheezing.No chest wall deformity  Skin:  no rash or lesions.  GU:  no dysuria, change in color of urine, no urgency or frequency. No flank pain.  Musculoskeletal:  No joint pain or swelling. No decreased range of motion. No back pain.  Psych:  No change in mood or affect. No depression or anxiety. No memory loss.   Past Medical History  Diagnosis Date  . Pancreatitis   . Hernia   . Hypertension   . Hypercholesteremia   . H/O hiatal hernia   . Arthritis   . Diverticulosis 2014    pancolonic per colonoscopy   Past Surgical History  Procedure Laterality Date  . Rotator cuff repair      bilateral-[facility]  . Cataract extraction w/phaco  11/05/2012    Procedure: CATARACT EXTRACTION PHACO AND INTRAOCULAR LENS PLACEMENT (IOC);  Surgeon: Tonny Branch, MD;  Location: [facility];  Service: Ophthalmology;  Laterality: Left;   CDE 18.11  . Cataract extraction w/phaco  11/19/2012    Procedure: CATARACT EXTRACTION PHACO AND INTRAOCULAR LENS PLACEMENT (IOC);  Surgeon: Tonny Branch, MD;  Location: [facility];  Service: Ophthalmology;  Laterality: Right;  CDE:  19.10  . Colonoscopy N/A 09/04/2013    [id number] diverticulosis  . 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 Mass excision Left 08/28/2014    Procedure: 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;  Surgeon: Daryll Brod, MD;  Location: [facility];  Service: Orthopedics;  Laterality: Left;   Social History:  reports that he has never smoked. He does not have any smokeless tobacco history on file. He reports that he does not drink alcohol or use illicit drugs.  No Known Allergies  History reviewed. No pertinent family history.   Prior to Admission medications   Medication Sig Start Date End Date Taking? Authorizing Provider  amLODipine (NORVASC) 10 MG tablet Take 10 mg by mouth daily.   Yes Historical Provider, MD  aspirin EC 81 MG tablet Take 81 mg by mouth daily.     Yes Historical Provider, MD  benazepril (LOTENSIN) 20 MG tablet Take 20 mg by mouth daily.   Yes Historical Provider, MD  finasteride (PROSCAR) 5 MG tablet Take 5 mg by mouth daily.   Yes Historical Provider, MD  Multiple Vitamins-Minerals (CENTRUM SILVER ULTRA MENS PO) Take 1 tablet by mouth daily.     Yes Historical Provider, MD  simvastatin (ZOCOR) 20 MG tablet Take  20 mg by mouth every evening.   Yes Historical Provider, MD  HYDROcodone-acetaminophen (NORCO) 5-325 MG per tablet Take 1 tablet by mouth every 6 (six) hours as needed for moderate pain. Patient not taking: Reported on 12/31/2014 08/28/14   Daryll Brod, MD   Physical Exam: Filed Vitals:   12/31/14 1107 12/31/14 1315 12/31/14 1542 12/31/14 1732  BP: 122/76 130/66 125/63 106/70  Pulse: 88     Temp: 98.7 F (37.1 C)     TempSrc: Oral     Resp: 16 18 14 18   Height: 5\' 8"  (1.727 m)       Weight: 75.297 kg (166 lb)     SpO2: 96% 95% 94% 99%    Wt Readings from Last 3 Encounters:  12/31/14 75.297 kg (166 lb)  12/31/14 75.751 kg (167 lb)  08/28/14 73.936 kg (163 lb)    General:  Appears calm and comfortable Eyes:  PERRL, normal lids, irises & conjunctiva ENT: Dry mucus membranes  Neck:  no LAD, masses or thyromegaly Cardiovascular:  RRR, no m/r/g. Trace LE edema Telemetry:  SR, no arrhythmias  Respiratory:  CTA bilaterally, no w/r/r. Normal respiratory effort. Abdomen: epigastric ttp, soft, hypoactive BS Skin:  no rash or induration seen on limited exam Musculoskeletal:  grossly normal tone BUE/BLE Psychiatric:  grossly normal mood and affect, speech fluent and appropriate Neurologic:  grossly non-focal.          Labs on Admission:  Basic Metabolic Panel:  Recent Labs Lab 12/31/14 1213  NA 137  K 3.8  CL 102  CO2 25  GLUCOSE 86  BUN 16  CREATININE 1.06  CALCIUM 9.2   Liver Function Tests:  Recent Labs Lab 12/31/14 1213  AST 32  ALT 20  ALKPHOS 51  BILITOT 2.5*  PROT 7.7  ALBUMIN 4.5    Recent Labs Lab 12/31/14 1213  LIPASE 2608*   No results for input(s): AMMONIA in the last 168 hours. CBC:  Recent Labs Lab 12/31/14 1213  WBC 17.8*  NEUTROABS 15.1*  HGB 15.2  HCT 44.7  MCV 91.6  PLT 208   Cardiac Enzymes: No results for input(s): CKTOTAL, CKMB, CKMBINDEX, TROPONINI in the last 168 hours.  BNP (last 3 results) No results for input(s): PROBNP in the last 8760 hours. CBG: No results for input(s): GLUCAP in the last 168 hours.  Radiological Exams on Admission: Ct Abdomen Pelvis W Contrast  12/31/2014   CLINICAL DATA:  Epigastric pain since 12/28/2013 with nausea starting today. History of pancreatitis 20 years ago.  EXAM: CT ABDOMEN AND PELVIS WITH CONTRAST  TECHNIQUE: Multidetector CT imaging of the abdomen and pelvis was performed using the standard protocol following bolus administration of intravenous contrast.  CONTRAST:   60mL OMNIPAQUE IOHEXOL 300 MG/ML SOLN, 115mL OMNIPAQUE IOHEXOL 300 MG/ML SOLN  COMPARISON:  Acute abdomen series is 02/11/2012.  No prior CT.  FINDINGS: Lower chest: Bibasilar atelectasis. Small hiatal hernia. Normal heart size without pericardial or pleural effusion. Left circumflex coronary artery atherosclerosis.  Hepatobiliary: Normal liver. No intrahepatic ductal dilatation. Borderline gallbladder distention, without specific evidence of acute cholecystitis. Small gallstones versus sludge dependently. Common duct is upper normal for age, 8-9 mm. No evidence of choledocholithiasis.  Pancreas: Mild pancreatic atrophy. Pancreatic duct borderline dilated at 4 mm in the pancreatic head on image 30. Followed to the level of the ampulla. Suspect mild peripancreatic edema, including surrounding the tail on image 24 transverse and image 54 coronal.  Spleen: Normal  Adrenals/Urinary Tract: Normal adrenal glands. Normal  kidneys, without hydronephrosis. Normal urinary bladder.  Stomach/Bowel: Normal distal stomach. Small descending duodenal/periampullary diverticulum. Extensive colonic diverticulosis. Normal terminal ileum. Suspected diminutive appendix on image 61. Minimal motion degradation in the mid abdomen.  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 Aortic and branch vessel atherosclerosis. 1.7 cm ectasia of the right common iliac artery. No abdominopelvic adenopathy.  Reproductive: Mild prostatomegaly.  Other: No significant free fluid. Left greater than right fat containing inguinal hernias.  Musculoskeletal: Degenerative partial fusion of the bilateral sacroiliac joints. Multilevel lumbar degenerative disc disease.  IMPRESSION: 1. Borderline gallbladder distention with stones or sludge. Common duct upper normal, without obstructive stone. Correlate with symptoms and possibly ultrasound to exclude acute cholecystitis. 2. Pancreatic atrophy with mild ductal prominence throughout. This is followed to the  level of the ampulla. This could be within normal variation. However, given borderline common duct dilatation (and elevated bili Rubin) MRCP or ERCP may be indicated to exclude biliary obstruction. 3. Possible subtle peripancreatic edema. Lipase evaluate is pending and will be informative to exclude acute pancreatitis. 4.  Atherosclerosis, including within the coronary arteries. 5. 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 6. Fat containing inguinal hernias. 7. Mild motion degradation.   Electronically Signed   By: Abigail Miyamoto M.D.   On: 12/31/2014 14:22   US Abdomen Limited  12/31/2014   CLINICAL DATA:  Epigastric pain and nausea and vomiting for 3 days.  EXAM: US ABDOMEN LIMITED - RIGHT UPPER QUADRANT  COMPARISON:  None.  FINDINGS: Gallbladder:  No gallstones or wall thickening visualized. No sonographic Murphy sign noted.  Common bile duct:  Diameter: 4 mm  Liver:  No focal lesion identified. Within normal limits in parenchymal echogenicity.  IMPRESSION: Negative.  No evidence of gallstones or biliary dilatation.   Electronically Signed   By: Earle Gell M.D.   On: 12/31/2014 15:25    EKG: Independently reviewed. NSR, no sign of ACS  Assessment/Plan Principal Problem:   Pancreatitis Active Problems:   Essential hypertension   HLD (hyperlipidemia)   BPH (benign prostatic hyperplasia)   Leukocytosis   74yo M w/ PMH of HTN, HLD, BPH, and idiopathic pancreatitis presenting w/ abd pain consistent w/ pancreatitis.   Pancreatitis: Lipase 2608. CT w/o pseudocyst and possible gallbladder distension. ABD US unremarkable for gallstones or other biliary abnormality. WBC 17.8. Denies ETOH use.  - Admit - Dr. Sydell Axon notified and will see on 01/01/14 - NPO - IVF NS 169ml/hr - Morphine - Lipid panel  HTN: normotensive - restart Norvasc and lotensin in the am - cont ASA  HLD: continue zocor  BPH:  - continue finasteride   Code Status: FULL DVT Prophylaxis: Heparin Family  Communication: None Disposition Plan: Pending improvement    Cadel Stairs, [facility] [email or web address] Password [email or web address]

## 2014-12-31 NOTE — Telephone Encounter (Signed)
I spoke with the pt. He has been having stomach pains for the last 3 days and chills last night. He woke up this morning and was heaving but never vomited. He is not sure if it is his pancreas or not. He will need an ov and will be seeing Randall Hiss today.

## 2014-12-31 NOTE — ED Notes (Signed)
Attempted report x1. 

## 2014-12-31 NOTE — Assessment & Plan Note (Signed)
Worsening epigastric abdominal pain over the past 3 days with a history of pancreatitis. Last pancreatitis exacerbation approximately 20 years ago. Concerning for acute pancreatitis but cannot rule out other acute process. Concerned with abdominal guarding to palpation of epigastric area.  Discussed with Dr. Oneida Alar and agreed on sending patient to the ER for further evaluation and workup.

## 2014-12-31 NOTE — ED Provider Notes (Signed)
CSN: [id number]     Arrival date & time 12/31/14  1054 History  This chart was scribed for Nat Christen, MD by Stephania Fragmin, ED Scribe. This patient was seen in room [facility]15/[facility] and the patient's care was started at 11:53 AM.    Chief Complaint  Patient presents with  . Abdominal Pain   The history is provided by the patient, the spouse and medical records. No language interpreter was used.     HPI Comments: Level V caveat for urgent need for intervention Russell Bryant is a 74 y.o. male with a history of pancreatitis who presents to the Emergency Department complaining of epigastric pain that began 3 days ago. Per wife, patient complains of associated chills, dry heaves, and loss of appetite. He was diagnosed with pancreatitis 20 years ago by Dr. Gala Romney. No radiation of pain. Severity is moderate.   Past Medical History  Diagnosis Date  . Pancreatitis   . Hernia   . Hypertension   . Hypercholesteremia   . H/O hiatal hernia   . Arthritis   . Diverticulosis 2014    pancolonic per colonoscopy   Past Surgical History  Procedure Laterality Date  . Rotator cuff repair      bilateral-[facility]  . Cataract extraction w/phaco  11/05/2012    Procedure: CATARACT EXTRACTION PHACO AND INTRAOCULAR LENS PLACEMENT (IOC);  Surgeon: Tonny Branch, MD;  Location: [facility];  Service: Ophthalmology;  Laterality: Left;  CDE 18.11  . Cataract extraction w/phaco  11/19/2012    Procedure: CATARACT EXTRACTION PHACO AND INTRAOCULAR LENS PLACEMENT (IOC);  Surgeon: Tonny Branch, MD;  Location: [facility];  Service: Ophthalmology;  Laterality: Right;  CDE:  19.10  . Colonoscopy N/A 09/04/2013    [id number] diverticulosis  . 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 Mass excision Left 08/28/2014    Procedure: 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;  Surgeon: Daryll Brod, MD;  Location: [facility];  Service: Orthopedics;  Laterality: Left;   History reviewed. No pertinent family  history. History  Substance Use Topics  . Smoking status: Never Smoker   . Smokeless tobacco: Not on file  . Alcohol Use: No    Review of Systems  Unable to perform ROS: Acuity of condition  Constitutional: Positive for chills and appetite change.  Gastrointestinal: Positive for nausea and abdominal pain.      Allergies  Review of patient's allergies indicates no known allergies.  Home Medications   Prior to Admission medications   Medication Sig Start Date End Date Taking? Authorizing Provider  amLODipine (NORVASC) 10 MG tablet Take 10 mg by mouth daily.   Yes Historical Provider, MD  aspirin EC 81 MG tablet Take 81 mg by mouth daily.     Yes Historical Provider, MD  benazepril (LOTENSIN) 20 MG tablet Take 20 mg by mouth daily.   Yes Historical Provider, MD  finasteride (PROSCAR) 5 MG tablet Take 5 mg by mouth daily.   Yes Historical Provider, MD  Multiple Vitamins-Minerals (CENTRUM SILVER ULTRA MENS PO) Take 1 tablet by mouth daily.     Yes Historical Provider, MD  simvastatin (ZOCOR) 20 MG tablet Take 20 mg by mouth every evening.   Yes Historical Provider, MD  HYDROcodone-acetaminophen (NORCO) 5-325 MG per tablet Take 1 tablet by mouth every 6 (six) hours as needed for moderate pain. Patient not taking: Reported on 12/31/2014 08/28/14   Daryll Brod, MD   BP 125/63 mmHg  Pulse 88  Temp(Src) 98.7 F (37.1 C) (Oral)  Resp 14  Ht 5\' 8"  (1.727 m)  Wt 166 lb (75.297 kg)  BMI 25.25 kg/m2  SpO2 94% Physical Exam  Constitutional: He is oriented to person, place, and time. He appears well-developed and well-nourished.  HENT:  Head: Normocephalic and atraumatic.  Eyes: Conjunctivae and EOM are normal. Pupils are equal, round, and reactive to light.  Neck: Normal range of motion. Neck supple.  Cardiovascular: Normal rate and regular rhythm.   Pulmonary/Chest: Effort normal and breath sounds normal.  Abdominal: Soft. Bowel sounds are normal. There is tenderness.  Tenderness in  epigastrium.  Musculoskeletal: Normal range of motion.  Neurological: He is alert and oriented to person, place, and time.  Skin: Skin is warm and dry.  Psychiatric: He has a normal mood and affect. His behavior is normal.  Nursing note and vitals reviewed.   ED Course  Procedures (including critical care time)  DIAGNOSTIC STUDIES: Oxygen Saturation is 96% on room air, normal by my interpretation.    COORDINATION OF CARE: 11:55 AM - Discussed treatment plan with pt at bedside which includes IV fluids, blood tests, CT abdomen, and likely hospital admission and pt agreed to plan. Patient states he would like pain medication at this time.   Labs Review Labs Reviewed  COMPREHENSIVE METABOLIC PANEL - Abnormal; Notable for the following:    Total Bilirubin 2.5 (*)    GFR calc non Af Amer 67 (*)    GFR calc Af Amer 77 (*)    All other components within normal limits  CBC WITH DIFFERENTIAL - Abnormal; Notable for the following:    WBC 17.8 (*)    Neutrophils Relative % 85 (*)    Neutro Abs 15.1 (*)    Lymphocytes Relative 4 (*)    Monocytes Absolute 1.9 (*)    All other components within normal limits  LIPASE, BLOOD - Abnormal; Notable for the following:    Lipase 2608 (*)    All other components within normal limits  URINALYSIS, ROUTINE W REFLEX MICROSCOPIC - Abnormal; Notable for the following:    Specific Gravity, Urine >1.030 (*)    Hgb urine dipstick MODERATE (*)    Bilirubin Urine SMALL (*)    Ketones, ur 40 (*)    Protein, ur 100 (*)    All other components within normal limits  URINE MICROSCOPIC-ADD ON    Imaging Review Ct Abdomen Pelvis W Contrast  12/31/2014   CLINICAL DATA:  Epigastric pain since 12/28/2013 with nausea starting today. History of pancreatitis 20 years ago.  EXAM: CT ABDOMEN AND PELVIS WITH CONTRAST  TECHNIQUE: Multidetector CT imaging of the abdomen and pelvis was performed using the standard protocol following bolus administration of intravenous  contrast.  CONTRAST:  40mL OMNIPAQUE IOHEXOL 300 MG/ML SOLN, 122mL OMNIPAQUE IOHEXOL 300 MG/ML SOLN  COMPARISON:  Acute abdomen series is 02/11/2012.  No prior CT.  FINDINGS: Lower chest: Bibasilar atelectasis. Small hiatal hernia. Normal heart size without pericardial or pleural effusion. Left circumflex coronary artery atherosclerosis.  Hepatobiliary: Normal liver. No intrahepatic ductal dilatation. Borderline gallbladder distention, without specific evidence of acute cholecystitis. Small gallstones versus sludge dependently. Common duct is upper normal for age, 8-9 mm. No evidence of choledocholithiasis.  Pancreas: Mild pancreatic atrophy. Pancreatic duct borderline dilated at 4 mm in the pancreatic head on image 30. Followed to the level of the ampulla. Suspect mild peripancreatic edema, including surrounding the tail on image 24 transverse and image 54 coronal.  Spleen: Normal  Adrenals/Urinary Tract: Normal adrenal glands. Normal kidneys, without hydronephrosis. Normal urinary bladder.  Stomach/Bowel: Normal distal stomach. Small descending duodenal/periampullary diverticulum. Extensive colonic diverticulosis. Normal terminal ileum. Suspected diminutive appendix on image 61. Minimal motion degradation in the mid abdomen.  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 Aortic and branch vessel atherosclerosis. 1.7 cm ectasia of the right common iliac artery. No abdominopelvic adenopathy.  Reproductive: Mild prostatomegaly.  Other: No significant free fluid. Left greater than right fat containing inguinal hernias.  Musculoskeletal: Degenerative partial fusion of the bilateral sacroiliac joints. Multilevel lumbar degenerative disc disease.  IMPRESSION: 1. Borderline gallbladder distention with stones or sludge. Common duct upper normal, without obstructive stone. Correlate with symptoms and possibly ultrasound to exclude acute cholecystitis. 2. Pancreatic atrophy with mild ductal prominence throughout.  This is followed to the level of the ampulla. This could be within normal variation. However, given borderline common duct dilatation (and elevated bili Rubin) MRCP or ERCP may be indicated to exclude biliary obstruction. 3. Possible subtle peripancreatic edema. Lipase evaluate is pending and will be informative to exclude acute pancreatitis. 4.  Atherosclerosis, including within the coronary arteries. 5. 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 6. Fat containing inguinal hernias. 7. Mild motion degradation.   Electronically Signed   By: Abigail Miyamoto M.D.   On: 12/31/2014 14:22   US Abdomen Limited  12/31/2014   CLINICAL DATA:  Epigastric pain and nausea and vomiting for 3 days.  EXAM: US ABDOMEN LIMITED - RIGHT UPPER QUADRANT  COMPARISON:  None.  FINDINGS: Gallbladder:  No gallstones or wall thickening visualized. No sonographic Murphy sign noted.  Common bile duct:  Diameter: 4 mm  Liver:  No focal lesion identified. Within normal limits in parenchymal echogenicity.  IMPRESSION: Negative.  No evidence of gallstones or biliary dilatation.   Electronically Signed   By: Earle Gell M.D.   On: 12/31/2014 15:25     EKG Interpretation   Date/Time:  Wednesday December 31 2014 11:22:54 EST Ventricular Rate:  80 PR Interval:  178 QRS Duration: 87 QT Interval:  361 QTC Calculation: 416 R Axis:   55 Text Interpretation:  Sinus rhythm Probable left atrial enlargement  Baseline wander in lead(s) III aVF Confirmed by Cythia Bachtel  MD, Licia Harl ([id number]) on  12/31/2014 12:24:25 PM      MDM   Final diagnoses:  Epigastric pain  Acute pancreatitis, unspecified pancreatitis type   No acute abdomen.  Lipase 2608.   Ultrasound negative for gallstones. CT scan report well-documented. Discussed with gastroenterology admit to general medicine..    I personally performed the services described in this documentation, which was scribed in my presence. The recorded information has been reviewed and is  accurate.      Nat Christen, MD 12/31/14 [phone]

## 2014-12-31 NOTE — Assessment & Plan Note (Signed)
PMH of pancreatitis with last acute episode approximately 20 years ago per patient. New onset of epigastric pain 3 days ago, worsening severity. Onset of nausea and dry heaves last night which continued to today, no appetite and unable to tolerate po intake currently. Also epigastric guarding with palpation, concerning for possible acute pancreatitis.  Discussed with Dr. Oneida Alar and agreed on sending patient to the ER for further evaluation and workup.

## 2014-12-31 NOTE — ED Notes (Signed)
Patient complaining of nausea and abdominal pain x 3 days. States he went to Dr Sydell Axon and was sent here for evaluation.

## 2014-12-31 NOTE — Progress Notes (Signed)
Referring Provider: Elsie Lincoln, MD Primary Care Physician:  Leonides Grills, MD  Primary GI: Dr. Gala Romney  Chief Complaint  Patient presents with  . Nausea  . Abdominal Pain    HPI:   74 year old male presents c/o abdominal pain. Has a history of pancreatitis. Also a history of diverticuloses per last colonoscopy dated 09/04/13. Abdominal pain started approximately 3 days ago and has gotten progressively worse. Also c/o nausea and dry heaving since last night and continued this morning. Abdominal pain is epigastric, constant, and patient is unable to qualify the pain characteristic. Denies diarrhea, constipation. Last BM was yesterday morning. Typically has a BM every morning which is normal formed. Denies hematochezia or melena. Decreased appetite, did not eat last night or this morning. Denies any other upper or lower GI symptoms.  Past Medical History  Diagnosis Date  . Pancreatitis   . Hernia   . Hypertension   . Hypercholesteremia   . H/O hiatal hernia   . Arthritis     Past Surgical History  Procedure Laterality Date  . Rotator cuff repair      bilateral-[facility]  . Cataract extraction w/phaco  11/05/2012    Procedure: CATARACT EXTRACTION PHACO AND INTRAOCULAR LENS PLACEMENT (IOC);  Surgeon: Tonny Branch, MD;  Location: [facility];  Service: Ophthalmology;  Laterality: Left;  CDE 18.11  . Cataract extraction w/phaco  11/19/2012    Procedure: CATARACT EXTRACTION PHACO AND INTRAOCULAR LENS PLACEMENT (IOC);  Surgeon: Tonny Branch, MD;  Location: [facility];  Service: Ophthalmology;  Laterality: Right;  CDE:  19.10  . Colonoscopy N/A 09/04/2013    KDT:OIZTIWP diverticulosis  . 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 Mass excision Left 08/28/2014    Procedure: 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;  Surgeon: Daryll Brod, MD;  Location: [place];  Service: Orthopedics;  Laterality: Left;    Current Outpatient Prescriptions    Medication Sig Dispense Refill  . amLODipine (NORVASC) 10 MG tablet Take 10 mg by mouth daily.    Marland Kitchen aspirin EC 81 MG tablet Take 81 mg by mouth daily.      . benazepril (LOTENSIN) 20 MG tablet Take 20 mg by mouth daily.    . finasteride (PROSCAR) 5 MG tablet Take 5 mg by mouth daily.    . Multiple Vitamins-Minerals (CENTRUM SILVER ULTRA MENS PO) Take 1 tablet by mouth daily.      . simvastatin (ZOCOR) 20 MG tablet Take 20 mg by mouth every evening.    Marland Kitchen HYDROcodone-acetaminophen (NORCO) 5-325 MG per tablet Take 1 tablet by mouth every 6 (six) hours as needed for moderate pain. (Patient not taking: Reported on 12/31/2014) 30 tablet 0   No current facility-administered medications for this visit.    Allergies as of 12/31/2014  . (No Known Allergies)    No family history on file.  History   Social History  . Marital Status: Married    Spouse Name: N/A    Number of Children: N/A  . Years of Education: N/A   Social History Main Topics  . Smoking status: Never Smoker   . Smokeless tobacco: None  . Alcohol Use: No  . Drug Use: No  . Sexual Activity: Yes    Birth Control/ Protection: None   Other Topics Concern  . None   Social History Narrative    Review of Systems: Gen: Denies fever, admits chills. Admits weakness, denies fatigue, weight loss.  CV: Denies  chest pain, palpitations, syncope, peripheral edema, and claudication. Resp: Denies dyspnea at rest, cough, wheezing, coughing up blood, and pleurisy. GI: See HPI. Denies vomiting blood, jaundice, and fecal incontinence.   Denies dysphagia or odynophagia. Derm: Denies rash, itching, dry skin Psych: Denies depression, anxiety, memory loss, confusion. No homicidal or suicidal ideation.  Heme: Denies bruising, bleeding, and enlarged lymph nodes.  Physical Exam: BP 156/76 mmHg  Pulse 82  Temp(Src) 98.6 F (37 C)  Ht 5\' 8"  (1.727 m)  Wt 167 lb (75.751 kg)  BMI 25.40 kg/m2 General:   Alert and oriented. Pleasant and  cooperative. Hunched forward and appears in pain. Head:  Normocephalic and atraumatic. Eyes:  Conjuctiva clear without scleral icterus. Mouth:  Oral mucosa pink and moist. Good dentition. No lesions. Heart:  S1, S2 present without murmurs, rubs, or gallops. Regular rate and rhythm. Abdomen:  +BS, soft, non-distended. No rebound. Severe pain to palpation of epigastrium with guarding noted. No HSM or masses noted. Soft abdominal hernia noted which is easily reducible.  Msk:  Symmetrical without gross deformities. Normal posture. Pulses:  2+ DP noted bilaterally Extremities:  Without edema. Neurologic:  Alert and  oriented x4;  grossly normal neurologically. Skin:  Intact without significant lesions or rashes. Psych:  Alert and cooperative. Normal mood and affect.    12/31/2014 10:07 AM

## 2014-12-31 NOTE — Telephone Encounter (Signed)
PATIENT HERE HAVING BAD STOMACH PAINS.  HAS HISTORY OF PANCREATITIS

## 2015-01-01 DIAGNOSIS — R1013 Epigastric pain: Secondary | ICD-10-CM | POA: Diagnosis present

## 2015-01-01 DIAGNOSIS — K859 Acute pancreatitis, unspecified: Principal | ICD-10-CM

## 2015-01-01 LAB — CBC
HCT: 36.8 % — ABNORMAL LOW (ref 39.0–52.0)
Hemoglobin: 12.6 g/dL — ABNORMAL LOW (ref 13.0–17.0)
MCH: 31.4 pg (ref 26.0–34.0)
MCHC: 34.2 g/dL (ref 30.0–36.0)
MCV: 91.8 fL (ref 78.0–100.0)
PLATELETS: 174 10*3/uL (ref 150–400)
RBC: 4.01 MIL/uL — ABNORMAL LOW (ref 4.22–5.81)
RDW: 13.1 % (ref 11.5–15.5)
WBC: 12 10*3/uL — AB (ref 4.0–10.5)

## 2015-01-01 LAB — BASIC METABOLIC PANEL
Anion gap: 6 (ref 5–15)
BUN: 14 mg/dL (ref 6–23)
CALCIUM: 7.7 mg/dL — AB (ref 8.4–10.5)
CO2: 24 mmol/L (ref 19–32)
Chloride: 105 mEq/L (ref 96–112)
Creatinine, Ser: 0.96 mg/dL (ref 0.50–1.35)
GFR calc Af Amer: >90 mL/min (ref >90)
GFR calc non Af Amer: 79 mL/min — ABNORMAL LOW (ref >90)
GLUCOSE: 108 mg/dL — AB (ref 70–99)
Potassium: 3.5 mmol/L (ref 3.5–5.1)
Sodium: 135 mmol/L (ref 135–145)

## 2015-01-01 LAB — HEPATIC FUNCTION PANEL
ALT: 15 U/L (ref 0–53)
AST: 23 U/L (ref 0–37)
Albumin: 3.2 g/dL — ABNORMAL LOW (ref 3.5–5.2)
Alkaline Phosphatase: 35 U/L — ABNORMAL LOW (ref 39–117)
BILIRUBIN DIRECT: 0.3 mg/dL (ref 0.0–0.3)
BILIRUBIN INDIRECT: 1.3 mg/dL — AB (ref 0.3–0.9)
Total Bilirubin: 1.6 mg/dL — ABNORMAL HIGH (ref 0.3–1.2)
Total Protein: 5.9 g/dL — ABNORMAL LOW (ref 6.0–8.3)

## 2015-01-01 LAB — LIPID PANEL
Cholesterol: 121 mg/dL (ref 0–200)
HDL: 39 mg/dL — AB (ref >39)
LDL Cholesterol: 68 mg/dL (ref 0–99)
Total CHOL/HDL Ratio: 3.1 RATIO
Triglycerides: 71 mg/dL (ref <150)
VLDL: 14 mg/dL (ref 0–40)

## 2015-01-01 LAB — LIPASE, BLOOD: Lipase: 375 U/L — ABNORMAL HIGH (ref 11–59)

## 2015-01-01 NOTE — Progress Notes (Signed)
UR chart review completed.  

## 2015-01-01 NOTE — Progress Notes (Signed)
TAELON BENDORF HQI:[id number] DOB: 11/21/1941 DOA: 12/31/2014 PCP: Rocky Morel, MD  Brief narrative: 74 y/o ? sent to [facility] 12/31/14 secondary to intractable abdominal pain, nausea vomiting, prior history cataract surgeries, prior syncope/vasovagal, hypertension, hyperlipidemia admitted secondary to pancreatitis. Lipase 2608, LFTs otherwise normal, total bilirubin 2.5, hemoglobin 12.6 Ultrasound abdomen pelvis showed common bile duct 4 mm with no focal lesion and no evidence of gallstones, biliary ductal dilatation He tells me 20 years ago he had a similar attack of gallstones but does not recall what was the etiology that was thought to be the cause at that time He also has a history of suspicious prostate areas that are under treatment by Dr. Gaynelle Arabian of urology. He has been on finasteride successfully and that has reduced his PSA from 8 to below detectable levels. He does take Bentyl as per and he does take Zocor both of which have been discontinued as he may be positive etiologies  Past medical history-As per Problem list Chart reviewed as below-   Consultants:  Gastroenterology  Procedures:  Ultrasound abdomen right upper quadrant  Antibiotics:  None currently   Subjective  Alert pleasant oriented pain once to 2/10. No current nausea vomiting tolerating fluids No stool today No diarrhea antecedent to this No itching No outside food No similarly ill sick contact   Objective    Interim History: Slightly low blood pressure a.m. of 1/7  Telemetry: None telemetry   Objective: Filed Vitals:   12/31/14 2200 01/01/15 0618 01/01/15 0818 01/01/15 0902  BP: 89/41 102/41 98/44 112/51  Pulse: 68 66 69 69  Temp: 99.4 F (37.4 C) 99.5 F (37.5 C)    TempSrc: Oral Oral    Resp: 18 18 20 18   Height:      Weight:      SpO2: 94% 93% 91% 93%    Intake/Output Summary (Last 24 hours) at 01/01/15 1006 Last data filed at 01/01/15 0900  Gross per 24 hour  Intake     360 ml  Output    450 ml  Net    -90 ml    Exam:  General: EOMI NCAT Cardiovascular: S1-S2 no murmur rub or gallop Respiratory: Clinically clear no added sound Abdomen: Soft nontender mild epigastric tenderness no rebound no guarding, noted hiatal hernia Skin no lower extremity edema Neuro grossly intact  Data Reviewed: Basic Metabolic Panel:  Recent Labs Lab 12/31/14 1213 01/01/15 0537  NA 137 135  K 3.8 3.5  CL 102 105  CO2 25 24  GLUCOSE 86 108*  BUN 16 14  CREATININE 1.06 0.96  CALCIUM 9.2 7.7*   Liver Function Tests:  Recent Labs Lab 12/31/14 1213  AST 32  ALT 20  ALKPHOS 51  BILITOT 2.5*  PROT 7.7  ALBUMIN 4.5    Recent Labs Lab 12/31/14 1213  LIPASE 2608*   No results for input(s): AMMONIA in the last 168 hours. CBC:  Recent Labs Lab 12/31/14 1213 01/01/15 0537  WBC 17.8* 12.0*  NEUTROABS 15.1*  --   HGB 15.2 12.6*  HCT 44.7 36.8*  MCV 91.6 91.8  PLT 208 174   Cardiac Enzymes: No results for input(s): CKTOTAL, CKMB, CKMBINDEX, TROPONINI in the last 168 hours. BNP: Invalid input(s): POCBNP CBG: No results for input(s): GLUCAP in the last 168 hours.  No results found for this or any previous visit (from the past 240 hour(s)).   Studies:              All Imaging  reviewed and is as per above notation   Scheduled Meds: . aspirin EC  81 mg Oral Daily  . finasteride  5 mg Oral Daily  . heparin  5,000 Units Subcutaneous 3 times per day  . simvastatin  20 mg Oral QPM   Continuous Infusions: . sodium chloride 150 mL/hr at 12/31/14 2131     Assessment/Plan: 1. Pancreatitis-questionable cause-I've discontinued most of his putative offending agents example benazepril, Zocor. He will continue his finasteride. I'm not sure if he needs an ERCP and will defer decision-making to Dr. Sydell Axon. Certainly he has no elevation of LFTs to suggest an obstructive pathology and may have had a past CBD stone. Increased to full liquid diets as patient  thinks he can eat 2. Hypertension-monitor trends over 90 today and may implement calcium channel blocker 3. Hyperlipidemia-hold off on statin for now 4. 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 Gaynelle Arabian is now Asian. Continue finasteride 5 mg daily  Code Status: Full Family Communication: Discussed with wife in detail at the bedside Disposition Plan: Inpatient none telemetry   Verneita Griffes, MD  [facility] Pager [phone] 01/01/2015, 10:06 AM    LOS: 1 day

## 2015-01-01 NOTE — Progress Notes (Signed)
Subjective:  Feels better. Abdominal pain much improved. Tolerating clear liquids.   Objective: Vital signs in last 24 hours: Temp:  [98.3 F (36.8 C)-99.5 F (37.5 C)] 99.5 F (37.5 C) (01/07 0618) Pulse Rate:  [66-88] 69 (01/07 0902) Resp:  [14-20] 18 (01/07 0902) BP: (89-132)/(41-76) 112/51 mmHg (01/07 0902) SpO2:  [91 %-99 %] 93 % (01/07 0902) Weight:  [166 lb (75.297 kg)] 166 lb (75.297 kg) (01/06 1107) Last BM Date: 12/30/14 General:   Alert,  Well-developed, well-nourished, pleasant and cooperative in NAD Head:  Normocephalic and atraumatic. Eyes:  Sclera clear, no icterus.  Abdomen:  Soft, mild epigastric tenderness and nondistended. No masses, hepatosplenomegaly or hernias noted. Normal bowel sounds, without guarding, and without rebound.   Extremities:  Without clubbing, deformity or edema. Neurologic:  Alert and  oriented x4;  grossly normal neurologically. Skin:  Intact without significant lesions or rashes. Psych:  Alert and cooperative. Normal mood and affect.  Intake/Output from previous day:   Intake/Output this shift: Total I/O In: 360 [P.O.:360] Out: 450 [Urine:450]  Lab Results: CBC  Recent Labs  12/31/14 1213 01/01/15 0537  WBC 17.8* 12.0*  HGB 15.2 12.6*  HCT 44.7 36.8*  MCV 91.6 91.8  PLT 208 174   BMET  Recent Labs  12/31/14 1213 01/01/15 0537  NA 137 135  K 3.8 3.5  CL 102 105  CO2 25 24  GLUCOSE 86 108*  BUN 16 14  CREATININE 1.06 0.96  CALCIUM 9.2 7.7*   LFTs  Recent Labs  12/31/14 1213  BILITOT 2.5*  ALKPHOS 51  AST 32  ALT 20  PROT 7.7  ALBUMIN 4.5    Recent Labs  12/31/14 1213  LIPASE 2608*   PT/INR No results for input(s): LABPROT, INR in the last 72 hours.    Imaging Studies: Ct Abdomen Pelvis W Contrast  12/31/2014   CLINICAL DATA:  Epigastric pain since 12/28/2013 with nausea starting today. History of pancreatitis 20 years ago.  EXAM: CT ABDOMEN AND PELVIS WITH CONTRAST  TECHNIQUE: Multidetector CT  imaging of the abdomen and pelvis was performed using the standard protocol following bolus administration of intravenous contrast.  CONTRAST:  12mL OMNIPAQUE IOHEXOL 300 MG/ML SOLN, 162mL OMNIPAQUE IOHEXOL 300 MG/ML SOLN  COMPARISON:  Acute abdomen series is 02/11/2012.  No prior CT.  FINDINGS: Lower chest: Bibasilar atelectasis. Small hiatal hernia. Normal heart size without pericardial or pleural effusion. Left circumflex coronary artery atherosclerosis.  Hepatobiliary: Normal liver. No intrahepatic ductal dilatation. Borderline gallbladder distention, without specific evidence of acute cholecystitis. Small gallstones versus sludge dependently. Common duct is upper normal for age, 8-9 mm. No evidence of choledocholithiasis.  Pancreas: Mild pancreatic atrophy. Pancreatic duct borderline dilated at 4 mm in the pancreatic head on image 30. Followed to the level of the ampulla. Suspect mild peripancreatic edema, including surrounding the tail on image 24 transverse and image 54 coronal.  Spleen: Normal  Adrenals/Urinary Tract: Normal adrenal glands. Normal kidneys, without hydronephrosis. Normal urinary bladder.  Stomach/Bowel: Normal distal stomach. Small descending duodenal/periampullary diverticulum. Extensive colonic diverticulosis. Normal terminal ileum. Suspected diminutive appendix on image 61. Minimal motion degradation in the mid abdomen.  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 Aortic and branch vessel atherosclerosis. 1.7 cm ectasia of the right common iliac artery. No abdominopelvic adenopathy.  Reproductive: Mild prostatomegaly.  Other: No significant free fluid. Left greater than right fat containing inguinal hernias.  Musculoskeletal: Degenerative partial fusion of the bilateral sacroiliac joints. Multilevel lumbar degenerative disc disease.  IMPRESSION: 1.  Borderline gallbladder distention with stones or sludge. Common duct upper normal, without obstructive stone. Correlate with  symptoms and possibly ultrasound to exclude acute cholecystitis. 2. Pancreatic atrophy with mild ductal prominence throughout. This is followed to the level of the ampulla. This could be within normal variation. However, given borderline common duct dilatation (and elevated bili Rubin) MRCP or ERCP may be indicated to exclude biliary obstruction. 3. Possible subtle peripancreatic edema. Lipase evaluate is pending and will be informative to exclude acute pancreatitis. 4.  Atherosclerosis, including within the coronary arteries. 5. 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 6. Fat containing inguinal hernias. 7. Mild motion degradation.   Electronically Signed   By: Abigail Miyamoto M.D.   On: 12/31/2014 14:22   US Abdomen Limited  12/31/2014   CLINICAL DATA:  Epigastric pain and nausea and vomiting for 3 days.  EXAM: US ABDOMEN LIMITED - RIGHT UPPER QUADRANT  COMPARISON:  None.  FINDINGS: Gallbladder:  No gallstones or wall thickening visualized. No sonographic Murphy sign noted.  Common bile duct:  Diameter: 4 mm  Liver:  No focal lesion identified. Within normal limits in parenchymal echogenicity.  IMPRESSION: Negative.  No evidence of gallstones or biliary dilatation.   Electronically Signed   By: Earle Gell M.D.   On: 12/31/2014 15:25  [2 weeks]   Assessment:  74 y/o male with second episode of pancreatitis, first one over 20 years ago. LFTs yesterday with total bili 2.5. CT suggestive of gallbladder stones/sludge, upper limit normal CBD diameter, mild pancreatic ductal prominence. Abd u/s unremarkable. Clinically patient improved today. Leukocytosis improved. Would question biliary etiology.  Patient has normal triglyceride level, no etoh history.   Plan: 1. Repeat LFTs today, f/u lipase. 2. Continue clear liquids for now.  3. May require MRCP or EUS.   Laureen Ochs. [facility] 1/7/201612:34 PM     LOS: 1 day    Attending note:  Patient  seems to be improved over that seen yesterday. Total bilirubin down to 1.6. Chills preceded acute abdominal pain earlier in the week. Bump in LFTs suggestive of transient biliary obstruction.  However, stone disease has not been documented at this time. 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 A small occult pancreatic tumor is not excluded this time.  I agree with the recommendations of imaging his biliary tree and pancreas further in about 6-8 weeks just to rule out possibility of a small tumor and to evaluate further for occult stone disease.  No clear indication for cholecystectomy at this time.

## 2015-01-01 NOTE — Care Management Note (Addendum)
    Page 1 of 1   01/02/2015     1:20:18 PM CARE MANAGEMENT NOTE 01/02/2015  Patient:  Russell Bryant, Russell Bryant   Account Number:  [id number]  Date Initiated:  01/01/2015  Documentation initiated by:  Theophilus Kinds  Subjective/Objective Assessment:   Pt admitted from home with pancreatitis. Pt lives with his wife and will return home at discharge. Pt is independent with ADl's.     Action/Plan:   No CM need noted.   Anticipated DC Date:  01/04/2015   Anticipated DC Plan:  [place]  CM consult      Choice offered to / List presented to:             Status of service:  Completed, signed off Medicare Important Message given?  YES (If response is "NO", the following Medicare IM given date fields will be blank) Date Medicare IM given:  01/02/2015 Medicare IM given by:  Theophilus Kinds Date Additional Medicare IM given:   Additional Medicare IM given by:    Discharge Disposition:  HOME/SELF CARE  Per UR Regulation:    If discussed at Long Length of Stay Meetings, dates discussed:    Comments:  01/02/15 [place], RN BSN CM Anticipate discharge within 24 hours. no CM needs noted.  01/01/15 [place], RN BSN CM

## 2015-01-02 MED ORDER — GUAIFENESIN 100 MG/5ML PO SYRP
200.0000 mg | ORAL_SOLUTION | ORAL | Status: DC | PRN
  Filled 2015-01-02: qty 10

## 2015-01-02 MED ORDER — MORPHINE SULFATE 2 MG/ML IJ SOLN: 1.0000 mg | INTRAMUSCULAR | Status: DC | PRN

## 2015-01-02 MED ORDER — IPRATROPIUM-ALBUTEROL 0.5-2.5 (3) MG/3ML IN SOLN
3.0000 mL | RESPIRATORY_TRACT | Status: DC
  Administered 2015-01-02 – 2015-01-03 (×6): 3 mL via RESPIRATORY_TRACT
  Filled 2015-01-02 (×6): qty 3

## 2015-01-02 MED ORDER — GUAIFENESIN 100 MG/5ML PO SOLN
5.0000 mL | ORAL | Status: DC | PRN
  Administered 2015-01-02 (×2): 100 mg via ORAL
  Filled 2015-01-02 (×2): qty 5

## 2015-01-02 NOTE — Progress Notes (Signed)
Subjective: Patient continues to clinically improve. Is tolerating full liquid diet fairly well since yesterday afternoon. Did have an episode of epigastric abdominal pain yesterday evening approximately 1900 which was relieved by morphine, no N/V at the time.  Since then has had no N/V or abdominal pain. At the time of visit had recently finished breakfast with no issues yet. Is complaining of congestion and some wheezing currently, otherwise feeling better.  Objective: Vital signs in last 24 hours: Temp:  [99.1 F (37.3 C)-99.3 F (37.4 C)] 99.1 F (37.3 C) (01/08 0702) Pulse Rate:  [65-73] 65 (01/08 0702) Resp:  [20] 20 (01/08 0702) BP: (97-105)/(43-56) 105/48 mmHg (01/08 0702) SpO2:  [90 %-98 %] 91 % (01/08 1149) Last BM Date: 12/31/14 General:   Alert and oriented, pleasant Head:  Normocephalic and atraumatic. Eyes:  No icterus, sclera clear. Conjuctiva pink.   Heart:  S1, S2 present, no murmurs noted.  Lungs: Mild bilateral expiratory wheezes otherwise clear to auscultation bilaterally, without rales, or rhonchi.  Abdomen:  See HPI. Bowel sounds present, soft, non-tender to palpation of entire abdomen, non-distended. No HSM or hernias noted. No rebound or guarding. No masses appreciated  Pulses:  Normal DP pulses noted. Extremities:  Without clubbing or edema. Neurologic:  Alert and  oriented x4;  grossly normal neurologically. Skin:  Warm and dry, intact without significant lesions.  Psych:  Alert and cooperative. Normal mood and affect.  Intake/Output from previous day: 01/07 0701 - 01/08 0700 In: 840 [P.O.:840] Out: 1450 [Urine:1450] Intake/Output this shift: Total I/O In: 400 [P.O.:400] Out: 500 [Urine:500]  Lab Results:  Recent Labs  12/31/14 1213 01/01/15 0537  WBC 17.8* 12.0*  HGB 15.2 12.6*  HCT 44.7 36.8*  PLT 208 174   BMET  Recent Labs  12/31/14 1213 01/01/15 0537  NA 137 135  K 3.8 3.5  CL 102 105  CO2 25 24  GLUCOSE 86 108*  BUN 16 14   CREATININE 1.06 0.96  CALCIUM 9.2 7.7*   LFT  Recent Labs  12/31/14 1213 01/01/15 0537  PROT 7.7 5.9*  ALBUMIN 4.5 3.2*  AST 32 23  ALT 20 15  ALKPHOS 51 35*  BILITOT 2.5* 1.6*  BILIDIR  --  0.3  IBILI  --  1.3*    Studies/Results: Ct Abdomen Pelvis W Contrast  12/31/2014   CLINICAL DATA:  Epigastric pain since 12/28/2013 with nausea starting today. History of pancreatitis 20 years ago.  EXAM: CT ABDOMEN AND PELVIS WITH CONTRAST  TECHNIQUE: Multidetector CT imaging of the abdomen and pelvis was performed using the standard protocol following bolus administration of intravenous contrast.  CONTRAST:  29mL OMNIPAQUE IOHEXOL 300 MG/ML SOLN, 168mL OMNIPAQUE IOHEXOL 300 MG/ML SOLN  COMPARISON:  Acute abdomen series is 02/11/2012.  No prior CT.  FINDINGS: Lower chest: Bibasilar atelectasis. Small hiatal hernia. Normal heart size without pericardial or pleural effusion. Left circumflex coronary artery atherosclerosis.  Hepatobiliary: Normal liver. No intrahepatic ductal dilatation. Borderline gallbladder distention, without specific evidence of acute cholecystitis. Small gallstones versus sludge dependently. Common duct is upper normal for age, 8-9 mm. No evidence of choledocholithiasis.  Pancreas: Mild pancreatic atrophy. Pancreatic duct borderline dilated at 4 mm in the pancreatic head on image 30. Followed to the level of the ampulla. Suspect mild peripancreatic edema, including surrounding the tail on image 24 transverse and image 54 coronal.  Spleen: Normal  Adrenals/Urinary Tract: Normal adrenal glands. Normal kidneys, without hydronephrosis. Normal urinary bladder.  Stomach/Bowel: Normal distal stomach. Small descending duodenal/periampullary  diverticulum. Extensive colonic diverticulosis. Normal terminal ileum. Suspected diminutive appendix on image 61. Minimal motion degradation in the mid abdomen.  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 Aortic and branch vessel  atherosclerosis. 1.7 cm ectasia of the right common iliac artery. No abdominopelvic adenopathy.  Reproductive: Mild prostatomegaly.  Other: No significant free fluid. Left greater than right fat containing inguinal hernias.  Musculoskeletal: Degenerative partial fusion of the bilateral sacroiliac joints. Multilevel lumbar degenerative disc disease.  IMPRESSION: 1. Borderline gallbladder distention with stones or sludge. Common duct upper normal, without obstructive stone. Correlate with symptoms and possibly ultrasound to exclude acute cholecystitis. 2. Pancreatic atrophy with mild ductal prominence throughout. This is followed to the level of the ampulla. This could be within normal variation. However, given borderline common duct dilatation (and elevated bili Rubin) MRCP or ERCP may be indicated to exclude biliary obstruction. 3. Possible subtle peripancreatic edema. Lipase evaluate is pending and will be informative to exclude acute pancreatitis. 4.  Atherosclerosis, including within the coronary arteries. 5. 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 6. Fat containing inguinal hernias. 7. Mild motion degradation.   Electronically Signed   By: Abigail Miyamoto M.D.   On: 12/31/2014 14:22   US Abdomen Limited  12/31/2014   CLINICAL DATA:  Epigastric pain and nausea and vomiting for 3 days.  EXAM: US ABDOMEN LIMITED - RIGHT UPPER QUADRANT  COMPARISON:  None.  FINDINGS: Gallbladder:  No gallstones or wall thickening visualized. No sonographic Murphy sign noted.  Common bile duct:  Diameter: 4 mm  Liver:  No focal lesion identified. Within normal limits in parenchymal echogenicity.  IMPRESSION: Negative.  No evidence of gallstones or biliary dilatation.   Electronically Signed   By: Earle Gell M.D.   On: 12/31/2014 15:25    Assessment: Patient continues to improve clinically. One episode of abdominal pain yesterday evening approximately 2 hours after eating. Will continue full liquid diet for  lunch and if tolerates breakfast and lunch without further pain, N/V can consider advancing to soft low fat diet. Yesterday LFTs improved as did lipase. No new labs drawn/ordered for today but pending orders for lipase and CMP tomorrow. Acute episode possibly related to microlithiasis although small acute pancreatic tumor cannot be excluded.  Plan: 1. Continue to monitor lipase and LTFs for improvement 2. Monitor for diet tolerance and consider advancing to soft, low far diet if tolerates breakfast and lunch with no further abdominal pain, N/V 3. Plan on outpatient GI follow-up post discharge as well as biliary imaging via MRCP 6-8 weeks after discharge for further workup   Walden Field, AGNP-C Adult & Gerontological Nurse Practitioner [facility]     LOS: 2 days    01/02/2015, 12:24 PM

## 2015-01-02 NOTE — Progress Notes (Signed)
Pt clear at this time feels much better will check on at 11 if asleep will let sleep and continue to monitor.

## 2015-01-02 NOTE — Progress Notes (Signed)
Russell Bryant PJA:[id number] DOB: 03-20-1941 DOA: 12/31/2014 PCP: Rocky Morel, MD  Brief narrative: 74 y/o ? sent to [facility] 12/31/14 secondary to intractable abdominal pain, nausea vomiting, prior history cataract surgeries, prior syncope/vasovagal, hypertension, hyperlipidemia admitted secondary to pancreatitis. Lipase 2608, LFTs otherwise normal, total bilirubin 2.5, hemoglobin 12.6 Ultrasound abdomen pelvis showed common bile duct 4 mm with no focal lesion and no evidence of gallstones, biliary ductal dilatation He tells me 20 years ago he had a similar attack of gallstones but does not recall what was the etiology that was thought to be the cause at that time He also has a history of suspicious prostate areas that are under treatment by Dr. Gaynelle Arabian of urology. He has been on finasteride successfully and that has reduced his PSA from 8 to below detectable levels. He does take Bentyl as per and he does take Zocor both of which have been discontinued as he may be positive etiologies  Past medical history-As per Problem list Chart reviewed as below-   Consultants:  Gastroenterology  Procedures:  Ultrasound abdomen right upper quadrant  Antibiotics:  None currently   Subjective   N and pain overnight needing Morphine Now eating Mac and cheese, beans, meat and so far so good.  No pain No stool yet NO fever or chills Some nasal stuffiness today so asked for inhaler   Objective    Interim History: Slightly low blood pressure a.m. of 1/7  Telemetry: None telemetry   Objective: Filed Vitals:   01/01/15 1300 01/01/15 2143 01/02/15 0702 01/02/15 1149  BP: 104/56 97/43 105/48   Pulse: 68 73 65   Temp: 99.1 F (37.3 C) 99.3 F (37.4 C) 99.1 F (37.3 C)   TempSrc: Oral Oral Oral   Resp: 20 20 20    Height:      Weight:      SpO2: 98% 92% 90% 91%    Intake/Output Summary (Last 24 hours) at 01/02/15 1207 Last data filed at 01/02/15 0919  Gross per 24 hour   Intake    880 ml  Output   1500 ml  Net   -620 ml    Exam:  General: EOMI NCAT Cardiovascular: S1-S2 no murmur rub or gallop Respiratory: Clinically clear no added sound Abdomen: Soft nontender mild epigastric tenderness no rebound no guarding, noted hiatal hernia   Data Reviewed: Basic Metabolic Panel:  Recent Labs Lab 12/31/14 1213 01/01/15 0537  NA 137 135  K 3.8 3.5  CL 102 105  CO2 25 24  GLUCOSE 86 108*  BUN 16 14  CREATININE 1.06 0.96  CALCIUM 9.2 7.7*   Liver Function Tests:  Recent Labs Lab 12/31/14 1213 01/01/15 0537  AST 32 23  ALT 20 15  ALKPHOS 51 35*  BILITOT 2.5* 1.6*  PROT 7.7 5.9*  ALBUMIN 4.5 3.2*    Recent Labs Lab 12/31/14 1213 01/01/15 0537  LIPASE 2608* 375*   No results for input(s): AMMONIA in the last 168 hours. CBC:  Recent Labs Lab 12/31/14 1213 01/01/15 0537  WBC 17.8* 12.0*  NEUTROABS 15.1*  --   HGB 15.2 12.6*  HCT 44.7 36.8*  MCV 91.6 91.8  PLT 208 174   Cardiac Enzymes: No results for input(s): CKTOTAL, CKMB, CKMBINDEX, TROPONINI in the last 168 hours. BNP: Invalid input(s): POCBNP CBG: No results for input(s): GLUCAP in the last 168 hours.  No results found for this or any previous visit (from the past 240 hour(s)).   Studies:  All Imaging reviewed and is as per above notation   Scheduled Meds: . aspirin EC  81 mg Oral Daily  . finasteride  5 mg Oral Daily  . heparin  5,000 Units Subcutaneous 3 times per day  . ipratropium-albuterol  3 mL Nebulization Q4H   Continuous Infusions:     Assessment/Plan: 1. Pancreatitis-? Passed CBD stone, ? unspecified other issue [Ca Head pancreas?]-Appreciate Dr. Sydell Axon.  Continue regualr diet.  Kipase 2600-->300's.  If resolves and tolerates PO, d/c home 1/9 with plan for 6 week CT/MR to evaluate Biliary system further 2. Hypertension-monitor trends over 90 today and may implement calcium channel blocker 3. Hyperlipidemia-hold off on statin for  now 4. 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 Gaynelle Arabian as OP. Continue finasteride 5 mg daily 5. Borderline hypotension-was getting 4 mg IV morphine which I have d/c  Code Status: Full Family Communication: Discussed with wife in detail at the bedside Disposition Plan: Inpatient none telemetry   Verneita Griffes, MD  [facility] Pager [phone] 01/02/2015, 12:07 PM    LOS: 2 days

## 2015-01-03 DIAGNOSIS — K850 Idiopathic acute pancreatitis: Secondary | ICD-10-CM

## 2015-01-03 LAB — COMPREHENSIVE METABOLIC PANEL
ALT: 58 U/L — ABNORMAL HIGH (ref 0–53)
AST: 48 U/L — AB (ref 0–37)
Albumin: 3.1 g/dL — ABNORMAL LOW (ref 3.5–5.2)
Alkaline Phosphatase: 56 U/L (ref 39–117)
Anion gap: 8 (ref 5–15)
BILIRUBIN TOTAL: 1 mg/dL (ref 0.3–1.2)
BUN: 8 mg/dL (ref 6–23)
CO2: 23 mmol/L (ref 19–32)
Calcium: 7.8 mg/dL — ABNORMAL LOW (ref 8.4–10.5)
Chloride: 107 mEq/L (ref 96–112)
Creatinine, Ser: 0.79 mg/dL (ref 0.50–1.35)
GFR calc Af Amer: >90 mL/min (ref >90)
GFR, EST NON AFRICAN AMERICAN: 86 mL/min — AB (ref >90)
Glucose, Bld: 101 mg/dL — ABNORMAL HIGH (ref 70–99)
Potassium: 3.4 mmol/L — ABNORMAL LOW (ref 3.5–5.1)
Sodium: 138 mmol/L (ref 135–145)
TOTAL PROTEIN: 6 g/dL (ref 6.0–8.3)

## 2015-01-03 LAB — LIPASE, BLOOD: Lipase: 40 U/L (ref 11–59)

## 2015-01-03 MED ORDER — POTASSIUM CHLORIDE CRYS ER 20 MEQ PO TBCR
20.0000 meq | EXTENDED_RELEASE_TABLET | Freq: Three times a day (TID) | ORAL | Status: DC
  Administered 2015-01-03: 20 meq via ORAL
  Filled 2015-01-03: qty 1

## 2015-01-03 MED ORDER — IPRATROPIUM-ALBUTEROL 0.5-2.5 (3) MG/3ML IN SOLN: 3.0000 mL | Freq: Three times a day (TID) | RESPIRATORY_TRACT | Status: DC

## 2015-01-03 MED ORDER — AMLODIPINE BESYLATE 10 MG PO TABS: 10.0000 mg | ORAL_TABLET | Freq: Every day | ORAL | Status: DC

## 2015-01-03 NOTE — Progress Notes (Signed)
Patient ID: Russell Bryant, male   DOB: [birth year], 74 y.o.   MRN: [id number]   Assessment/Plan: ADMITTED WITH ACUTE IDIOPATHIC PANCREATITIS. CLINICALLY IMPROVED.  PLAN: 1. D/C HOME TODAY 2. HFP IN 7 DAYS 3. LOW FAT DIET 4. MRCP IN 6 WEEKS.   Subjective: Since I last evaluated the patient PT DENIES NAUSEA OR VOMITING OR ABDOMINAL PAIN.  Objective: Vital signs in last 24 hours: Filed Vitals:   01/03/15 0538  BP: 132/58  Pulse: 69  Temp: 99.3 F (37.4 C)  Resp: 17     General appearance: alert, cooperative and no distress Resp: clear to auscultation bilaterally Cardio: regular rate and rhythm GI: soft, non-tender; bowel sounds normal; no masses,  no organomegaly  Lab Results:  LIPASE 40 K 3.4 AST 58 ALT 48    Studies/Results: No results found.  Medications: I have reviewed the patient's current medications.   LOS: 5 days   Barney Drain 06/05/2014, 2:23 PM

## 2015-01-03 NOTE — Telephone Encounter (Signed)
PT NEEDS HFP IN 7 DAYS AND MRCP IN 6 WEEKS.

## 2015-01-03 NOTE — Progress Notes (Signed)
Pt to be discharged home.  Reviewed discharge instructions with patient, answered questions.  IV removed, pt tolerated well.  Will continue to monitor until pt leaves the floor.

## 2015-01-03 NOTE — Discharge Summary (Signed)
Physician Discharge Summary  Russell Bryant:[id number] DOB: 21-Oct-1941 DOA: 12/31/2014  PCP: Rocky Morel, MD  Admit date: 12/31/2014 Discharge date: 01/03/2015  Time spent: Greater than 30 minutes  Recommendations for Outpatient Follow-up:  1. The patient will need hepatic function panel rechecked in 7 days and MRCP in 6 weeks. These will be scheduled per GI. Would also recommend rechecking the patient's serum potassium. 2. The patient was instructed to restart amlodipine in 3 days due to low-normal blood pressures. 3. The patient was instructed to not take simvastatin until his hepatic function panel was rechecked.    Discharge Diagnoses:  1. Acute pancreatitis. Idiopathic versus unknown. Etiology not clearly biliary pancreatitis. --- Patient's lipase was 2608 on admission and 40 at the time of discharge. 2. Mild pancreatic ductal prominence. 3. Mild hepatic transaminitis with mild hyperbilirubinemia. The patient's AST was 48 and ALT was 58 at the time of discharge. Total bilirubin was 2.5 on admission and 1.0 at the time of discharge. 3. Hyperlipidemia. Triglyceride level was within normal limits at 71. 4. History of hypertension with low-normal blood pressures during hospital course. 5. BPH. 6. Leukocytosis, secondary to acute pancreatitis. WBC 12.0 at the time of discharge. 7. Mild hypokalemia.  Discharge Condition: Improved.  Diet recommendation: Low-fat.  Filed Weights   12/31/14 1107  Weight: 75.297 kg (166 lb)    History of present illness:  The patient is a 74 year old man with a history of hypertension and hyperlipidemia, who presented to the emergency department on 12/31/14 with a chief complaint of abdominal pain, anorexia, and subjective fever and chills. The patient was evaluated by Dr. Roseanne Kaufman PA in the office and was advised to come to the emergency department for further evaluation. In the ED, he was afebrile and hemodynamically stable. His lab data were  significant for a lipase of 2608 and total bilirubin of 2.5. His AST and ALT were within normal limits. His white blood cell count was 17.8. His urinalysis revealed 100 protein, 40 ketones, and moderate hemoglobin. He was admitted for further evaluation and management.  Hospital Course:  The patient was started on vigorous IV fluids. He was given IV analgesics as needed for pain and IV antiemetics as needed for nausea. Some of the medications which were started on admission were discontinued, namely benazepril and Zocor. A CT of his abdomen and pelvis was ordered for evaluation. It revealed borderline gallbladder distention with possible stones or sludge; common bile duct upper limits of normal; no visual common bile duct stone; pancreatic atrophy with mild ductal prominence throughout; and possible subtle peripancreatic edema. Dr. Gala Romney and colleagues were consulted during the hospitalization. They recommended ordering an ultrasound of the abdomen for further evaluation. The ultrasound revealed no gallstones or wall thickening visualized and no sonographic Murphy sign noted. Per Dr. Roseanne Kaufman assessment, a bump in the patient's LFTs was suggestive of transient biliary obstruction. However, stone disease was not clearly documented per radiographic studies. He suspected that the patient had microlithiasis to account for his pancreatitis, but a small occult pancreatic tumor could not be excluded. He recommended that the patient undergo outpatient biliary tree and pay [facility] imaging in 6-8 weeks with MRCP to rule out a possibility of a small tumor and to evaluate further for all cold stone disease. There was no clear indication for need of a cholecystectomy during the hospitalization.  The patient's lipid panel was assessed and revealed a total cholesterol of 121, triglycerides 71, HDL 39, and LDL 68. His WBC decreased to  12.0. Leukocytosis was thought to be secondary to acute pancreatitis. His lipase normalized to  40. His AST and ALT increased slightly to 48 and 58 respectively, but his total bilirubin decreased to 1.0.  The patient's symptoms subsided and then resolved. His diet was advanced which he tolerated well. He had a bowel movement prior to discharge. He had no ongoing abdominal pain. The cause of his low-normal blood pressures during hospitalization, he was instructed to hold amlodipine for 3 days and to restart lisinopril. He was also instructed to hold the simvastatin until he was reevaluated with a hepatic function panel in 1 week as will be ordered by GI. He voiced understanding.  Procedures:  None  Consultations:  Gastroenterology  Discharge Exam: Filed Vitals:   01/03/15 0538  BP: 132/58  Pulse: 69  Temp: 99.3 F (37.4 C)  Resp: 17    General: Pleasant 74 year old man laying in bed, in no acute distress. Cardiovascular: S1, S2, with a soft systolic murmur. Respiratory: Clear to auscultation bilaterally. Abdomen: Positive bowel sounds, soft, nontender, nondistended.  Discharge Instructions   Discharge Instructions    Diet general    Complete by:  As directed      Discharge instructions    Complete by:  As directed   1.FOLLOW A LOW FAT DIET. 2. RESTART AMLODIPINE IN 3 DAYS. 3. DO NOT TAKE SIMVASTATIN UNTIL YOUR BLOOD WORK IS RECHECKED BY DR. FIELDS OR PARTNER.     Increase activity slowly    Complete by:  As directed           Current Discharge Medication List    CONTINUE these medications which have CHANGED   Details  amLODipine (NORVASC) 10 MG tablet Take 1 tablet (10 mg total) by mouth daily. RESTART AMLODIPINE IN 3 DAYS.      CONTINUE these medications which have NOT CHANGED   Details  aspirin EC 81 MG tablet Take 81 mg by mouth daily.      benazepril (LOTENSIN) 20 MG tablet Take 20 mg by mouth daily.    finasteride (PROSCAR) 5 MG tablet Take 5 mg by mouth daily.    Multiple Vitamins-Minerals (CENTRUM SILVER ULTRA MENS PO) Take 1 tablet by mouth  daily.        STOP taking these medications     simvastatin (ZOCOR) 20 MG tablet        No Known Allergies Follow-up Information    Follow up with Barney Drain, MD.   Specialty:  Gastroenterology   Why:  THEIR OFFICE WILL CALL YOU FOR THE FOLLOW UP.   Contact information:   [address] [place] [address] [phone]        The results of significant diagnostics from this hospitalization (including imaging, microbiology, ancillary and laboratory) are listed below for reference.    Significant Diagnostic Studies: Ct Abdomen Pelvis W Contrast  12/31/2014   CLINICAL DATA:  Epigastric pain since 12/28/2013 with nausea starting today. History of pancreatitis 20 years ago.  EXAM: CT ABDOMEN AND PELVIS WITH CONTRAST  TECHNIQUE: Multidetector CT imaging of the abdomen and pelvis was performed using the standard protocol following bolus administration of intravenous contrast.  CONTRAST:  34mL OMNIPAQUE IOHEXOL 300 MG/ML SOLN, 169mL OMNIPAQUE IOHEXOL 300 MG/ML SOLN  COMPARISON:  Acute abdomen series is 02/11/2012.  No prior CT.  FINDINGS: Lower chest: Bibasilar atelectasis. Small hiatal hernia. Normal heart size without pericardial or pleural effusion. Left circumflex coronary artery atherosclerosis.  Hepatobiliary: Normal liver. No  intrahepatic ductal dilatation. Borderline gallbladder distention, without specific evidence of acute cholecystitis. Small gallstones versus sludge dependently. Common duct is upper normal for age, 8-9 mm. No evidence of choledocholithiasis.  Pancreas: Mild pancreatic atrophy. Pancreatic duct borderline dilated at 4 mm in the pancreatic head on image 30. Followed to the level of the ampulla. Suspect mild peripancreatic edema, including surrounding the tail on image 24 transverse and image 54 coronal.  Spleen: Normal  Adrenals/Urinary Tract: Normal adrenal glands. Normal kidneys, without hydronephrosis. Normal urinary bladder.   Stomach/Bowel: Normal distal stomach. Small descending duodenal/periampullary diverticulum. Extensive colonic diverticulosis. Normal terminal ileum. Suspected diminutive appendix on image 61. Minimal motion degradation in the mid abdomen.  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 Aortic and branch vessel atherosclerosis. 1.7 cm ectasia of the right common iliac artery. No abdominopelvic adenopathy.  Reproductive: Mild prostatomegaly.  Other: No significant free fluid. Left greater than right fat containing inguinal hernias.  Musculoskeletal: Degenerative partial fusion of the bilateral sacroiliac joints. Multilevel lumbar degenerative disc disease.  IMPRESSION: 1. Borderline gallbladder distention with stones or sludge. Common duct upper normal, without obstructive stone. Correlate with symptoms and possibly ultrasound to exclude acute cholecystitis. 2. Pancreatic atrophy with mild ductal prominence throughout. This is followed to the level of the ampulla. This could be within normal variation. However, given borderline common duct dilatation (and elevated bili Rubin) MRCP or ERCP may be indicated to exclude biliary obstruction. 3. Possible subtle peripancreatic edema. Lipase evaluate is pending and will be informative to exclude acute pancreatitis. 4.  Atherosclerosis, including within the coronary arteries. 5. 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 6. Fat containing inguinal hernias. 7. Mild motion degradation.   Electronically Signed   By: Abigail Miyamoto M.D.   On: 12/31/2014 14:22   US Abdomen Limited  12/31/2014   CLINICAL DATA:  Epigastric pain and nausea and vomiting for 3 days.  EXAM: US ABDOMEN LIMITED - RIGHT UPPER QUADRANT  COMPARISON:  None.  FINDINGS: Gallbladder:  No gallstones or wall thickening visualized. No sonographic Murphy sign noted.  Common bile duct:  Diameter: 4 mm  Liver:  No focal lesion identified. Within normal limits in parenchymal echogenicity.   IMPRESSION: Negative.  No evidence of gallstones or biliary dilatation.   Electronically Signed   By: Earle Gell M.D.   On: 12/31/2014 15:25    Microbiology: No results found for this or any previous visit (from the past 240 hour(s)).   Labs: Basic Metabolic Panel:  Recent Labs Lab 12/31/14 1213 01/01/15 0537 01/03/15 0532  NA 137 135 138  K 3.8 3.5 3.4*  CL 102 105 107  CO2 25 24 23   GLUCOSE 86 108* 101*  BUN 16 14 8   CREATININE 1.06 0.96 0.79  CALCIUM 9.2 7.7* 7.8*   Liver Function Tests:  Recent Labs Lab 12/31/14 1213 01/01/15 0537 01/03/15 0532  AST 32 23 48*  ALT 20 15 58*  ALKPHOS 51 35* 56  BILITOT 2.5* 1.6* 1.0  PROT 7.7 5.9* 6.0  ALBUMIN 4.5 3.2* 3.1*    Recent Labs Lab 12/31/14 1213 01/01/15 0537 01/03/15 0532  LIPASE 2608* 375* 40   No results for input(s): AMMONIA in the last 168 hours. CBC:  Recent Labs Lab 12/31/14 1213 01/01/15 0537  WBC 17.8* 12.0*  NEUTROABS 15.1*  --   HGB 15.2 12.6*  HCT 44.7 36.8*  MCV 91.6 91.8  PLT 208 174   Cardiac Enzymes: No results for input(s): CKTOTAL, CKMB, CKMBINDEX, TROPONINI in  the last 168 hours. BNP: BNP (last 3 results) No results for input(s): PROBNP in the last 8760 hours. CBG: No results for input(s): GLUCAP in the last 168 hours.     Signed:  Beuford Garcilazo  [facility] 01/03/2015, 1:10 PM

## 2015-01-07 ENCOUNTER — Telehealth: Payer: Self-pay

## 2015-01-07 ENCOUNTER — Encounter: Payer: Self-pay | Admitting: Internal Medicine

## 2015-01-07 ENCOUNTER — Other Ambulatory Visit: Payer: Self-pay

## 2015-01-07 DIAGNOSIS — K859 Acute pancreatitis, unspecified: Secondary | ICD-10-CM | POA: Diagnosis not present

## 2015-01-07 NOTE — Telephone Encounter (Signed)
Tried to call pt- LMOM Please schedule ov 

## 2015-01-07 NOTE — Telephone Encounter (Signed)
Pt is aware and he will go to the lab on Friday or Saturday morning. He said he is feeling much better now.

## 2015-01-07 NOTE — Telephone Encounter (Signed)
Please see below the patient needs MRCP and follow-up visit.

## 2015-01-07 NOTE — Telephone Encounter (Signed)
APPT MADE AND LETTER SENT  °

## 2015-01-07 NOTE — Telephone Encounter (Signed)
PATIENT NEEDS MRCP IN 6 WEEKS

## 2015-01-07 NOTE — Telephone Encounter (Signed)
Lab orders for the HFP have been faxed to [facility]. Routing to [employer]. ( RMR pt).

## 2015-01-09 LAB — HEPATIC FUNCTION PANEL
ALT: 25 U/L (ref 0–53)
AST: 24 U/L (ref 0–37)
Albumin: 3.7 g/dL (ref 3.5–5.2)
Alkaline Phosphatase: 62 U/L (ref 39–117)
BILIRUBIN INDIRECT: 0.8 mg/dL (ref 0.2–1.2)
BILIRUBIN TOTAL: 1 mg/dL (ref 0.2–1.2)
Bilirubin, Direct: 0.2 mg/dL (ref 0.0–0.3)
TOTAL PROTEIN: 7 g/dL (ref 6.0–8.3)

## 2015-01-26 ENCOUNTER — Other Ambulatory Visit: Payer: Self-pay

## 2015-01-26 DIAGNOSIS — Z8719 Personal history of other diseases of the digestive system: Secondary | ICD-10-CM

## 2015-01-26 NOTE — Telephone Encounter (Signed)
Letter mailed with appointment for MRCP on 2/10@7 :45

## 2015-01-29 NOTE — Progress Notes (Signed)
REVIEWED. AGREE. NO ADDITIONAL RECOMMENDATIONS. 

## 2015-02-02 ENCOUNTER — Encounter: Payer: Self-pay | Admitting: Nurse Practitioner

## 2015-02-02 ENCOUNTER — Ambulatory Visit (INDEPENDENT_AMBULATORY_CARE_PROVIDER_SITE_OTHER): Payer: Medicare Other | Admitting: Nurse Practitioner

## 2015-02-02 VITALS — BP 147/74 | HR 66 | Temp 97.4°F | Ht 68.0 in | Wt 165.8 lb

## 2015-02-02 DIAGNOSIS — Z8719 Personal history of other diseases of the digestive system: Secondary | ICD-10-CM

## 2015-02-02 NOTE — Patient Instructions (Signed)
1. Keep your appointment for your MRCP on Wednesday 2. We will call you with the MRCP results when we get them back. 3. Follow-up as needed for any GI issues.

## 2015-02-02 NOTE — Progress Notes (Signed)
Quick Note:  Pt had OV today and is aware of results. ______

## 2015-02-02 NOTE — Assessment & Plan Note (Signed)
S/P admission for acute pancreatitis deemed likely transient biliary obstruction. Continue with plan for MRCP 6 weeks post discharge (scheduled for 2 days from now). Patient now completely symptomatic "I feel great." LFTs are normal. States he was told to hold his cholesterol medication until follow-up, when results back from MRCP can make recommendations on this.

## 2015-02-02 NOTE — Progress Notes (Signed)
Referring Provider: Caren Macadam, MD Primary Care Physician:  Rocky Morel, MD Primary GI: Dr. Gala Romney  Chief Complaint  Patient presents with  . Follow-up    HPI:   74 year old male presents for follow-up on hispitalization for acute pancreatitis. Was seen in our office with a history of remote pancreatitis and c/o severe abdominal pain, N/V, unable to tolerate po intake. Sent to ER for further evaluation and Dx pancreatitis and admitted for 4 days. A CT of his abdomen and pelvis was ordered for evaluation. It revealed borderline gallbladder distention with possible stones or sludge; common bile duct upper limits of normal; no visual common bile duct stone; pancreatic atrophy with mild ductal prominence throughout; and possible subtle peripancreatic edema. An ultrasound revealed no gallstones or wall thickening visualized and no sonographic Murphy sign noted. Per Dr. Roseanne Kaufman assessment, a bump in the patient's LFTs was suggestive of transient biliary obstruction. However, stone disease was not clearly documented per radiographic studies. He suspected that the patient had microlithiasis to account for his pancreatitis, but a small occult pancreatic tumor could not be excluded. He recommended that the patient undergo outpatient biliary tree and pay [facility] imaging in 6-8 weeks with MRCP to rule out a possibility of a small tumor and to evaluate further for all cold stone disease. Repeat CMP 1 week after discharge showed return to normal for all liver function labs (AST/ALT 24/25, Bili 1.0, direct bili 0.2, alk phos 62).  Today states he's doing well. Has had no recurrence of his symptoms. States "I'm feeling great." Has MRCP already scheduled for 2 days from now as recommended. Denies N/V/abdominal pain. Has a BM daily which is normal formed, no constipation or diarrhea. Denies hematochezia or melena. Denies dysphagia. Denies any other upper or lower GI symptoms.  Past Medical  History  Diagnosis Date  . Pancreatitis   . Hernia   . Hypertension   . Hypercholesteremia   . H/O hiatal hernia   . Arthritis   . Diverticulosis 2014    pancolonic per colonoscopy    Past Surgical History  Procedure Laterality Date  . Rotator cuff repair      bilateral-[facility]  . Cataract extraction w/phaco  11/05/2012    Procedure: CATARACT EXTRACTION PHACO AND INTRAOCULAR LENS PLACEMENT (IOC);  Surgeon: Tonny [place], MD;  Location: [facility];  Service: Ophthalmology;  Laterality: Left;  CDE 18.11  . Cataract extraction w/phaco  11/19/2012    Procedure: CATARACT EXTRACTION PHACO AND INTRAOCULAR LENS PLACEMENT (IOC);  Surgeon: Tonny Branch, MD;  Location: [facility];  Service: Ophthalmology;  Laterality: Right;  CDE:  19.10  . Colonoscopy N/A 09/04/2013    GLO:VFIEPPI diverticulosis  . 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 Mass excision Left 08/28/2014    Procedure: 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;  Surgeon: Daryll Brod, MD;  Location: [facility];  Service: Orthopedics;  Laterality: Left;    Current Outpatient Prescriptions  Medication Sig Dispense Refill  . amLODipine (NORVASC) 10 MG tablet Take 1 tablet (10 mg total) by mouth daily. RESTART AMLODIPINE IN 3 DAYS.    Marland Kitchen aspirin EC 81 MG tablet Take 81 mg by mouth daily.      . benazepril (LOTENSIN) 20 MG tablet Take 20 mg by mouth daily.    . finasteride (PROSCAR) 5 MG tablet Take 5 mg by mouth daily.    . Multiple Vitamins-Minerals (CENTRUM SILVER ULTRA MENS PO) Take 1  tablet by mouth daily.       No current facility-administered medications for this visit.    Allergies as of 02/02/2015  . (No Known Allergies)    No family history on file.  History   Social History  . Marital Status: Married    Spouse Name: N/A    Number of Children: N/A  . Years of Education: N/A   Social History Main Topics  . Smoking status: Never Smoker   . Smokeless tobacco: None  . Alcohol  Use: No  . Drug Use: No  . Sexual Activity: Yes    Birth Control/ Protection: None   Other Topics Concern  . None   Social History Narrative    Review of Systems: Gen: Denies fever, chills, anorexia. Denies fatigue, weakness, weight loss.  CV: Denies chest pain, palpitations, syncope, peripheral edema. Resp: Denies dyspnea at rest, cough, wheezing. GI: See HPI Denies vomiting blood, jaundice.   Denies dysphagia or odynophagia. Derm: Denies rash, itching, dry skin Psych: Denies depression, anxiety, memory loss, confusion. Heme: Denies bruising, bleeding, and enlarged lymph nodes.  Physical Exam: BP 147/74 mmHg  Pulse 66  Temp(Src) 97.4 F (36.3 C) (Oral)  Ht _0  (1.727 m)  Wt 165 lb 12.8 oz (75.206 kg)  BMI 25.22 kg/m2 General:   Alert and oriented. No distress noted. Pleasant and cooperative.  Head:  Normocephalic and atraumatic. Lungs:  Clear to auscultation bilaterally. No wheezes, rales, or rhonchi. No distress.  Heart:  S1, S2 present without murmurs, rubs, or gallops. Regular rate and rhythm. Abdomen:  +BS, soft, non-tender and non-distended. No rebound or guarding. No HSM or masses noted. Extremities:  Without edema. Neurologic:  Alert and  oriented x4;  grossly normal neurologically. Skin:  Intact without significant lesions or rashes. Psych:  Alert and cooperative. Normal mood and affect.    02/02/2015 8:54 AM

## 2015-02-03 NOTE — Progress Notes (Signed)
cc'ed to pcp °

## 2015-02-04 ENCOUNTER — Other Ambulatory Visit: Payer: Self-pay | Admitting: Gastroenterology

## 2015-02-04 ENCOUNTER — Ambulatory Visit (HOSPITAL_COMMUNITY)
Admission: RE | Admit: 2015-02-04 | Discharge: 2015-02-04 | Disposition: A | Discharge status: Home / Self Care | Payer: Medicare Other | Source: Ambulatory Visit | Attending: Gastroenterology | Admitting: Gastroenterology

## 2015-02-04 DIAGNOSIS — K859 Acute pancreatitis, unspecified: Secondary | ICD-10-CM | POA: Diagnosis not present

## 2015-02-04 DIAGNOSIS — Z8719 Personal history of other diseases of the digestive system: Secondary | ICD-10-CM | POA: Diagnosis not present

## 2015-02-04 DIAGNOSIS — R1010 Upper abdominal pain, unspecified: Secondary | ICD-10-CM | POA: Insufficient documentation

## 2015-02-04 DIAGNOSIS — R935 Abnormal findings on diagnostic imaging of other abdominal regions, including retroperitoneum: Secondary | ICD-10-CM | POA: Diagnosis not present

## 2015-02-04 MED ORDER — GADOBENATE DIMEGLUMINE 529 MG/ML IV SOLN
15.0000 mL | Freq: Once | INTRAVENOUS | Status: AC | PRN
  Administered 2015-02-04: 15 mL via INTRAVENOUS

## 2015-02-13 ENCOUNTER — Telehealth: Payer: Self-pay | Admitting: Internal Medicine

## 2015-02-13 NOTE — Telephone Encounter (Signed)
Routing to EG for results.  

## 2015-02-13 NOTE — Telephone Encounter (Signed)
PATIENT CAME IN OFFICE INQUIRING ABOUT MRI RESULTS   PLEASE CALL HIM ON HIS CELL [phone]

## 2015-02-16 ENCOUNTER — Other Ambulatory Visit: Payer: Self-pay

## 2015-02-16 DIAGNOSIS — K859 Acute pancreatitis, unspecified: Secondary | ICD-10-CM

## 2015-02-17 ENCOUNTER — Telehealth: Payer: Self-pay

## 2015-02-17 NOTE — Telephone Encounter (Signed)
Called pts home and spoke with pts wife Stanton Kidney.  Informed her of pts appointment at [facility]).  Pt has an appointment with Dr. Alvera Novel on 04/23/2015 @ 12:00pm.  Pt has address and phone number.

## 2015-02-18 NOTE — Telephone Encounter (Signed)
Results have been relayed to patient.

## 2015-04-03 DIAGNOSIS — K850 Idiopathic acute pancreatitis: Secondary | ICD-10-CM | POA: Diagnosis not present

## 2015-04-16 DIAGNOSIS — Z6824 Body mass index (BMI) 24.0-24.9, adult: Secondary | ICD-10-CM | POA: Diagnosis not present

## 2015-04-16 DIAGNOSIS — M5431 Sciatica, right side: Secondary | ICD-10-CM | POA: Diagnosis not present

## 2015-04-16 DIAGNOSIS — E782 Mixed hyperlipidemia: Secondary | ICD-10-CM | POA: Diagnosis not present

## 2015-07-16 DIAGNOSIS — H52221 Regular astigmatism, right eye: Secondary | ICD-10-CM | POA: Diagnosis not present

## 2015-07-16 DIAGNOSIS — H524 Presbyopia: Secondary | ICD-10-CM | POA: Diagnosis not present

## 2015-07-16 DIAGNOSIS — H5201 Hypermetropia, right eye: Secondary | ICD-10-CM | POA: Diagnosis not present

## 2015-08-21 DIAGNOSIS — Z1389 Encounter for screening for other disorder: Secondary | ICD-10-CM | POA: Diagnosis not present

## 2015-08-21 DIAGNOSIS — Z0000 Encounter for general adult medical examination without abnormal findings: Secondary | ICD-10-CM | POA: Diagnosis not present

## 2015-08-21 DIAGNOSIS — E782 Mixed hyperlipidemia: Secondary | ICD-10-CM | POA: Diagnosis not present

## 2015-08-21 DIAGNOSIS — Z79899 Other long term (current) drug therapy: Secondary | ICD-10-CM | POA: Diagnosis not present

## 2015-09-07 DIAGNOSIS — Z23 Encounter for immunization: Secondary | ICD-10-CM | POA: Diagnosis not present

## 2015-12-30 DIAGNOSIS — N401 Enlarged prostate with lower urinary tract symptoms: Secondary | ICD-10-CM | POA: Diagnosis not present

## 2016-01-06 DIAGNOSIS — N401 Enlarged prostate with lower urinary tract symptoms: Secondary | ICD-10-CM | POA: Diagnosis not present

## 2016-01-06 DIAGNOSIS — R3912 Poor urinary stream: Secondary | ICD-10-CM | POA: Diagnosis not present

## 2016-01-06 DIAGNOSIS — Z0000 Encounter for general adult medical examination without abnormal findings: Secondary | ICD-10-CM | POA: Diagnosis not present

## 2016-01-06 DIAGNOSIS — R972 Elevated prostate specific antigen [PSA]: Secondary | ICD-10-CM | POA: Diagnosis not present

## 2016-01-22 DIAGNOSIS — R05 Cough: Secondary | ICD-10-CM | POA: Diagnosis not present

## 2016-01-22 DIAGNOSIS — R0981 Nasal congestion: Secondary | ICD-10-CM | POA: Diagnosis not present

## 2016-01-22 DIAGNOSIS — Z1389 Encounter for screening for other disorder: Secondary | ICD-10-CM | POA: Diagnosis not present

## 2016-01-22 DIAGNOSIS — I10 Essential (primary) hypertension: Secondary | ICD-10-CM | POA: Diagnosis not present

## 2016-02-15 DIAGNOSIS — J019 Acute sinusitis, unspecified: Secondary | ICD-10-CM | POA: Diagnosis not present

## 2016-02-15 DIAGNOSIS — Z1389 Encounter for screening for other disorder: Secondary | ICD-10-CM | POA: Diagnosis not present

## 2016-02-15 DIAGNOSIS — J209 Acute bronchitis, unspecified: Secondary | ICD-10-CM | POA: Diagnosis not present

## 2016-02-15 DIAGNOSIS — Z6824 Body mass index (BMI) 24.0-24.9, adult: Secondary | ICD-10-CM | POA: Diagnosis not present

## 2016-02-19 DIAGNOSIS — J069 Acute upper respiratory infection, unspecified: Secondary | ICD-10-CM | POA: Diagnosis not present

## 2016-02-19 DIAGNOSIS — J209 Acute bronchitis, unspecified: Secondary | ICD-10-CM | POA: Diagnosis not present

## 2016-09-15 DIAGNOSIS — Z85828 Personal history of other malignant neoplasm of skin: Secondary | ICD-10-CM | POA: Diagnosis not present

## 2016-09-15 DIAGNOSIS — D220 Melanocytic nevi of lip: Secondary | ICD-10-CM | POA: Diagnosis not present

## 2016-09-15 DIAGNOSIS — L814 Other melanin hyperpigmentation: Secondary | ICD-10-CM | POA: Diagnosis not present

## 2016-09-15 DIAGNOSIS — L570 Actinic keratosis: Secondary | ICD-10-CM | POA: Diagnosis not present

## 2016-09-15 DIAGNOSIS — D225 Melanocytic nevi of trunk: Secondary | ICD-10-CM | POA: Diagnosis not present

## 2016-09-15 DIAGNOSIS — L821 Other seborrheic keratosis: Secondary | ICD-10-CM | POA: Diagnosis not present

## 2016-10-21 DIAGNOSIS — Z1389 Encounter for screening for other disorder: Secondary | ICD-10-CM | POA: Diagnosis not present

## 2016-10-21 DIAGNOSIS — Z6824 Body mass index (BMI) 24.0-24.9, adult: Secondary | ICD-10-CM | POA: Diagnosis not present

## 2016-10-21 DIAGNOSIS — Z23 Encounter for immunization: Secondary | ICD-10-CM | POA: Diagnosis not present

## 2016-10-21 DIAGNOSIS — Z0000 Encounter for general adult medical examination without abnormal findings: Secondary | ICD-10-CM | POA: Diagnosis not present

## 2016-10-21 IMAGING — CT CT ABD-PELV W/ CM
2 of 5 series · 15 of 46 positions shown, 17 images · IV contrast (Omnipaque 300)
Comparison: Acute abdomen series is 02/11/2012.  No prior CT.

CLINICAL DATA: Epigastric pain since 12/28/2013 with nausea
starting today. History of pancreatitis 20 years ago.

EXAM:
CT ABDOMEN AND PELVIS WITH CONTRAST
TECHNIQUE: Multidetector CT imaging of the abdomen and pelvis was performed
using the standard protocol following bolus administration of
intravenous contrast.
CONTRAST:  25mL OMNIPAQUE IOHEXOL 300 MG/ML SOLN, 100mL OMNIPAQUE
IOHEXOL 300 MG/ML SOLN

[Series 2: abd_pel_with 5.0 b40f · axial · 0.74mm/px · z∈[-403,+37]mm · 12 of 100 slices shown, 14 images]
[im 6/100  soft-tissue]
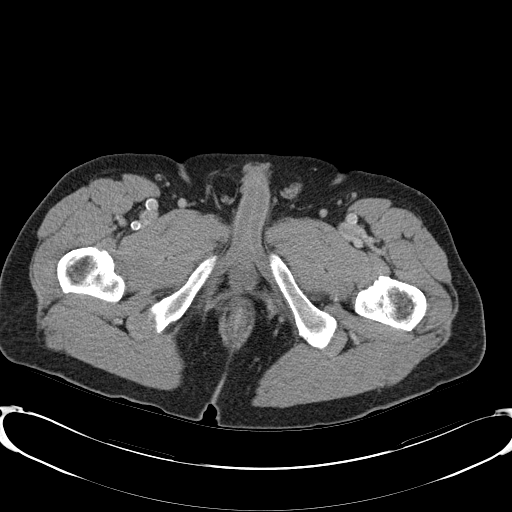
[im 6/100  bone]
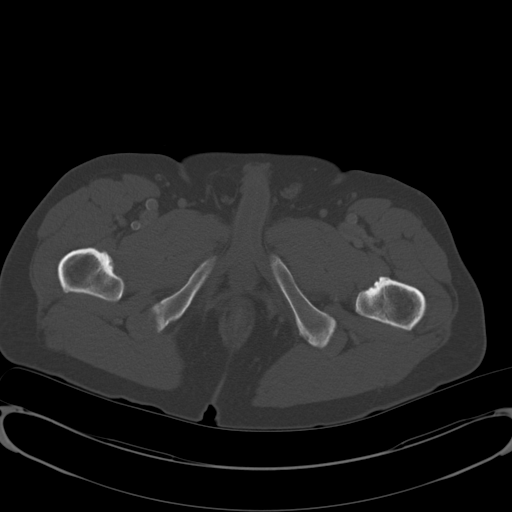
[im 18/100  soft-tissue]
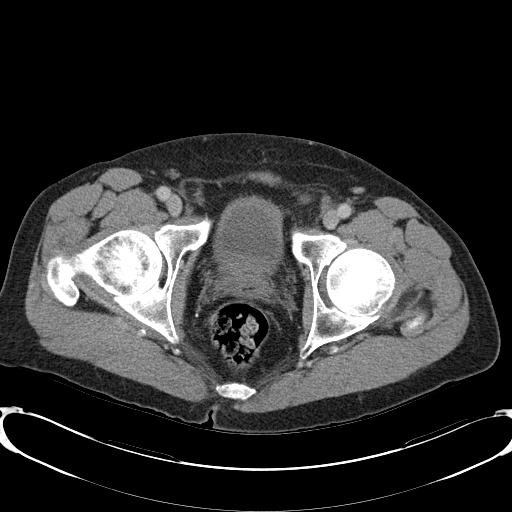
[im 24/100  soft-tissue]
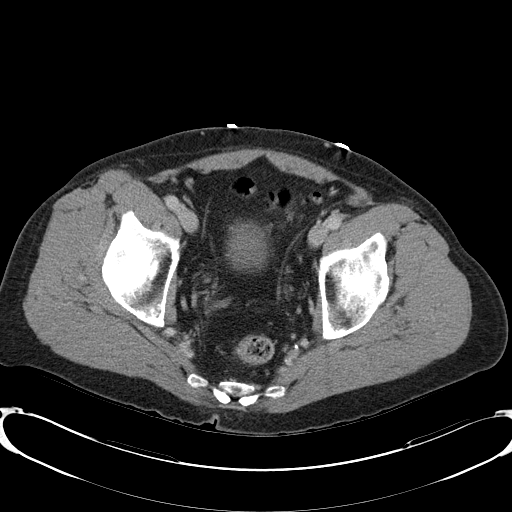
[im 30/100  soft-tissue]
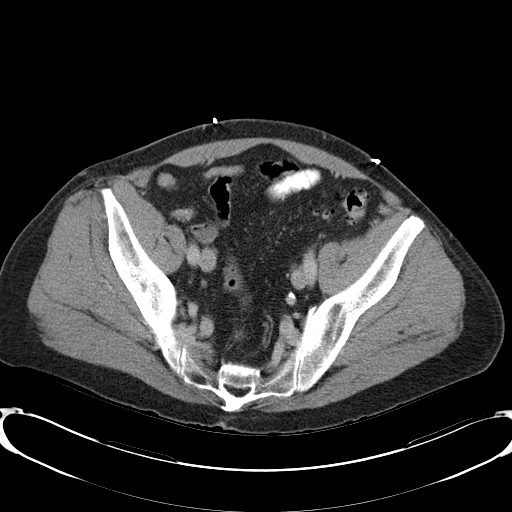
[im 41/100  soft-tissue]
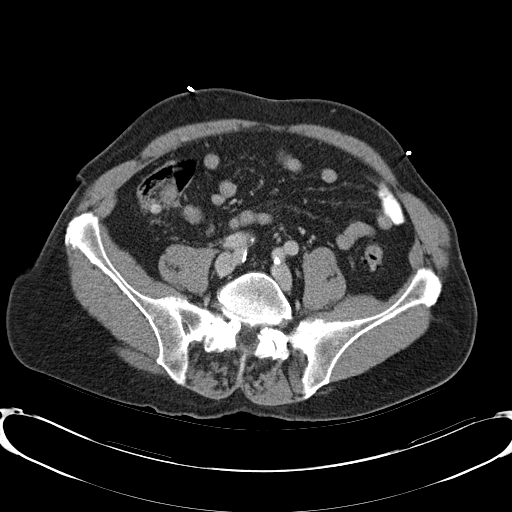
[im 47/100  soft-tissue]
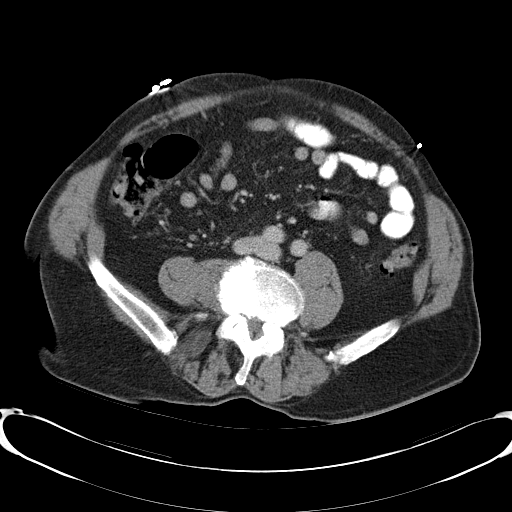
[im 53/100  soft-tissue]
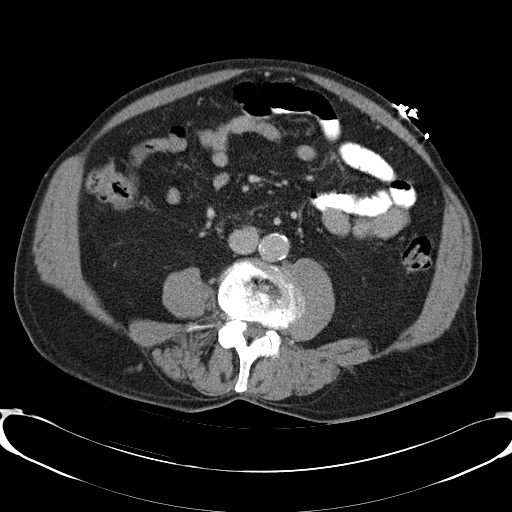
[im 65/100  soft-tissue]
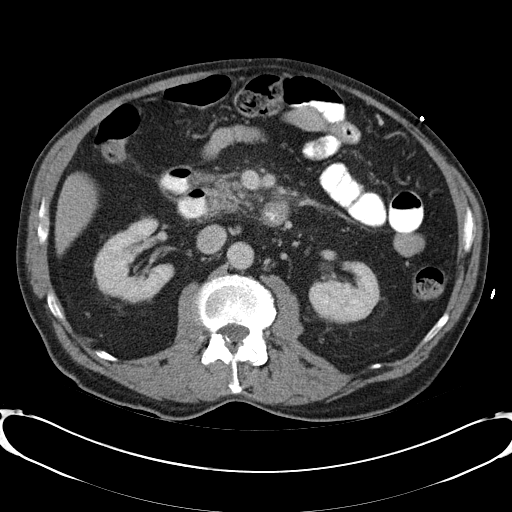
[im 70/100  soft-tissue]
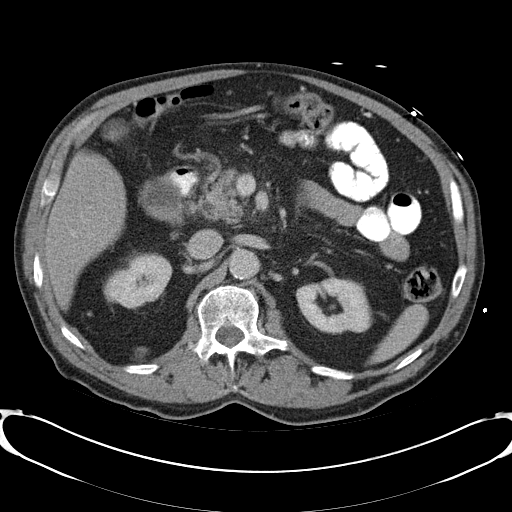
[im 70/100  bone]
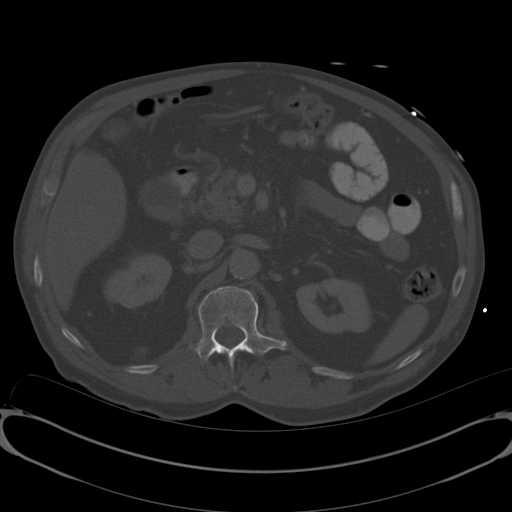
[im 76/100  soft-tissue]
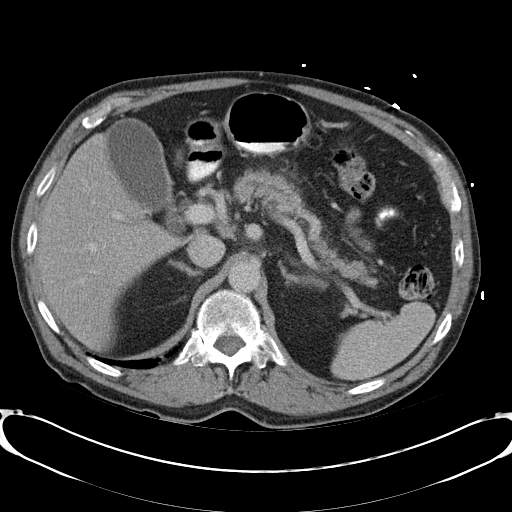
[im 88/100  soft-tissue]
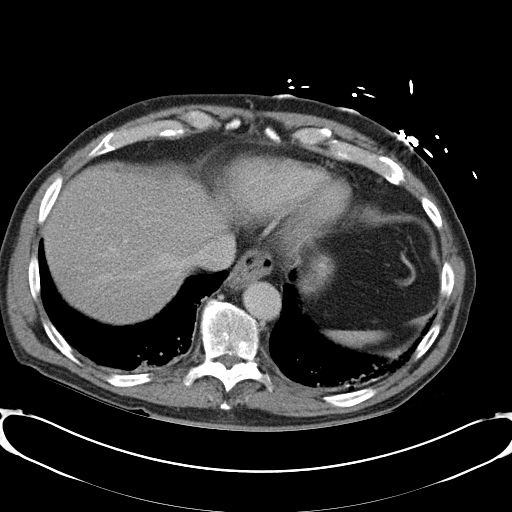
[im 94/100  soft-tissue]
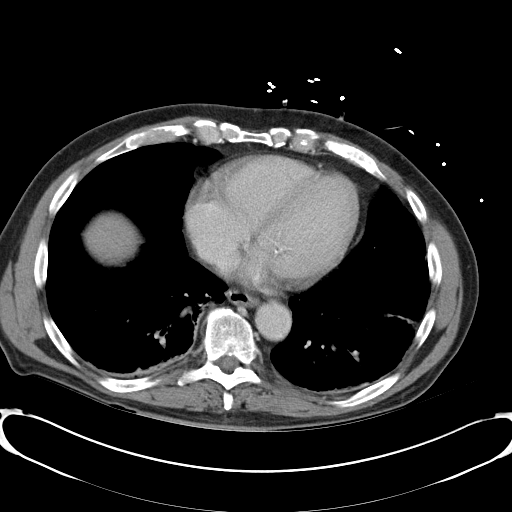

[Series 3: abd_pel_with 3.0 spo cor · coronal · 0.76mm/px · 3 of 86 slices shown]
[im 29/86  soft-tissue]
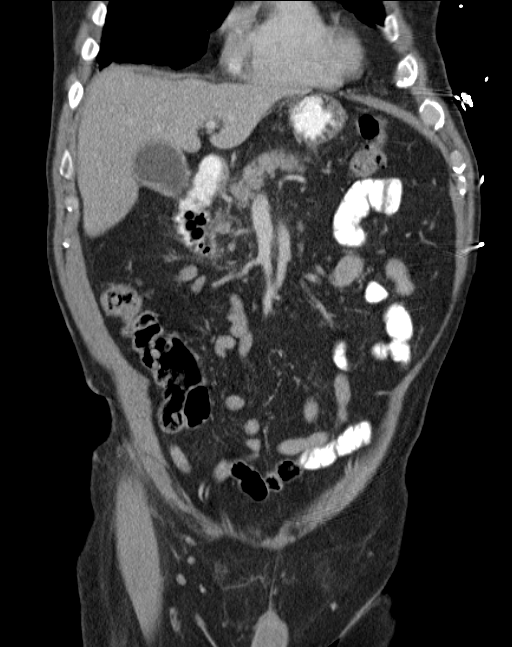
[im 38/86  soft-tissue]
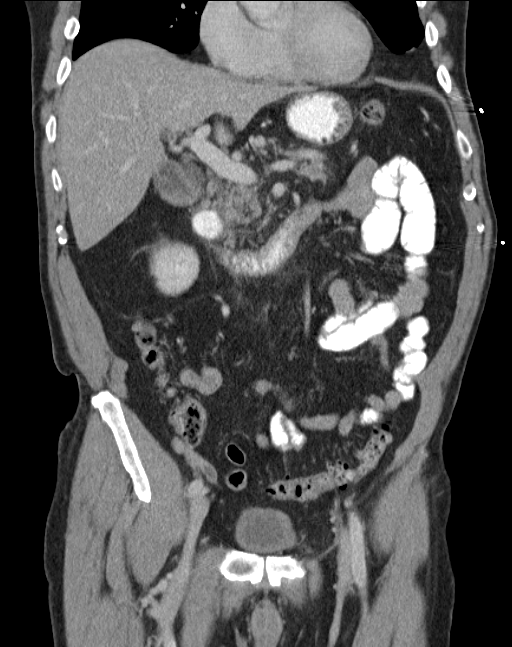
[im 48/86  soft-tissue]
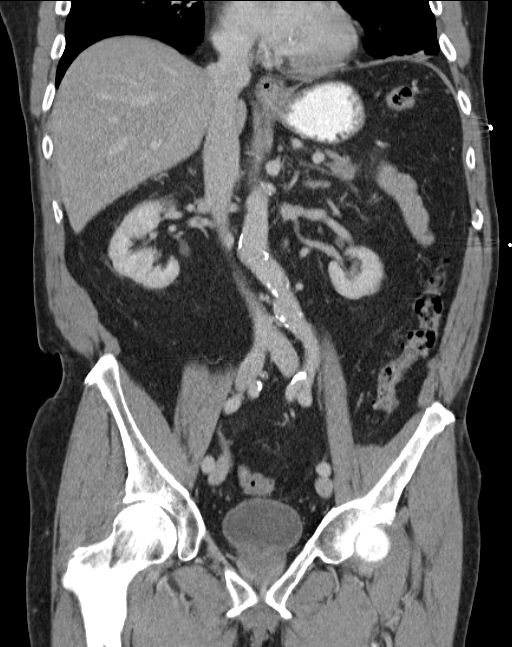

[15 of 46 positions shown; findings below may reference images not displayed]

FINDINGS: Lower chest: Bibasilar atelectasis. Small hiatal hernia. Normal
heart size without pericardial or pleural effusion. Left circumflex
coronary artery atherosclerosis.

Hepatobiliary: Normal liver. No intrahepatic ductal dilatation.
Borderline gallbladder distention, without specific evidence of
acute cholecystitis. Small gallstones versus sludge dependently.
Common duct is upper normal for age, 8-9 mm. No evidence of
choledocholithiasis.

Pancreas: Mild pancreatic atrophy. Pancreatic duct borderline
dilated at 4 mm in the pancreatic head on image 30. Followed to the
level of the ampulla. Suspect mild peripancreatic edema, including
surrounding the tail on image 24 transverse and image 54 coronal.

Spleen: Normal

Adrenals/Urinary Tract: Normal adrenal glands. Normal kidneys,
without hydronephrosis. Normal urinary bladder.

Stomach/Bowel: Normal distal stomach. Small descending
duodenal/periampullary diverticulum. Extensive colonic
diverticulosis. Normal terminal ileum. Suspected diminutive appendix
on image 61. Minimal motion degradation in the mid abdomen.

Vascular/Lymphatic: 1.3 cm ectasia this celiac origin. Aortic and
branch vessel atherosclerosis. 1.7 cm ectasia of the right common
iliac artery. No abdominopelvic adenopathy.

Reproductive: Mild prostatomegaly.

Other: No significant free fluid. Left greater than right fat
containing inguinal hernias.

Musculoskeletal: Degenerative partial fusion of the bilateral
sacroiliac joints. Multilevel lumbar degenerative disc disease.
IMPRESSION: 1. Borderline gallbladder distention with stones or sludge. Common
duct upper normal, without obstructive stone. Correlate with
symptoms and possibly ultrasound to exclude acute cholecystitis.
2. Pancreatic atrophy with mild ductal prominence throughout. This
is followed to the level of the ampulla. This could be within normal
variation. However, given borderline common duct dilatation (and
elevated bili Rubin) MRCP or ERCP may be indicated to exclude
biliary obstruction.
3. Possible subtle peripancreatic edema. Lipase evaluate is pending
and will be informative to exclude acute pancreatitis.
4.  Atherosclerosis, including within the coronary arteries.
5. Ectasia of the right common iliac artery and celiac origin.
6. Fat containing inguinal hernias.
7. Mild motion degradation.

## 2016-12-23 DIAGNOSIS — Z1389 Encounter for screening for other disorder: Secondary | ICD-10-CM | POA: Diagnosis not present

## 2016-12-23 DIAGNOSIS — J069 Acute upper respiratory infection, unspecified: Secondary | ICD-10-CM | POA: Diagnosis not present

## 2017-01-02 ENCOUNTER — Observation Stay (HOSPITAL_COMMUNITY)
Admission: EM | Admit: 2017-01-02 | Discharge: 2017-01-04 | Disposition: A | Discharge status: Home / Self Care | Payer: Medicare Other | Attending: Internal Medicine | Admitting: Internal Medicine

## 2017-01-02 ENCOUNTER — Encounter (HOSPITAL_COMMUNITY): Payer: Self-pay | Admitting: Emergency Medicine

## 2017-01-02 ENCOUNTER — Emergency Department (HOSPITAL_COMMUNITY): Payer: Medicare Other

## 2017-01-02 DIAGNOSIS — E878 Other disorders of electrolyte and fluid balance, not elsewhere classified: Secondary | ICD-10-CM | POA: Insufficient documentation

## 2017-01-02 DIAGNOSIS — E871 Hypo-osmolality and hyponatremia: Secondary | ICD-10-CM | POA: Insufficient documentation

## 2017-01-02 DIAGNOSIS — Z79899 Other long term (current) drug therapy: Secondary | ICD-10-CM | POA: Diagnosis not present

## 2017-01-02 DIAGNOSIS — E860 Dehydration: Secondary | ICD-10-CM | POA: Diagnosis not present

## 2017-01-02 DIAGNOSIS — J181 Lobar pneumonia, unspecified organism: Secondary | ICD-10-CM | POA: Diagnosis not present

## 2017-01-02 DIAGNOSIS — N400 Enlarged prostate without lower urinary tract symptoms: Secondary | ICD-10-CM | POA: Insufficient documentation

## 2017-01-02 DIAGNOSIS — E7800 Pure hypercholesterolemia, unspecified: Secondary | ICD-10-CM | POA: Diagnosis not present

## 2017-01-02 DIAGNOSIS — R0782 Intercostal pain: Secondary | ICD-10-CM | POA: Diagnosis not present

## 2017-01-02 DIAGNOSIS — R05 Cough: Secondary | ICD-10-CM | POA: Diagnosis not present

## 2017-01-02 DIAGNOSIS — J189 Pneumonia, unspecified organism: Principal | ICD-10-CM | POA: Insufficient documentation

## 2017-01-02 DIAGNOSIS — X58XXXA Exposure to other specified factors, initial encounter: Secondary | ICD-10-CM | POA: Diagnosis not present

## 2017-01-02 DIAGNOSIS — I10 Essential (primary) hypertension: Secondary | ICD-10-CM | POA: Diagnosis not present

## 2017-01-02 DIAGNOSIS — Z7982 Long term (current) use of aspirin: Secondary | ICD-10-CM | POA: Insufficient documentation

## 2017-01-02 DIAGNOSIS — S29011A Strain of muscle and tendon of front wall of thorax, initial encounter: Secondary | ICD-10-CM | POA: Insufficient documentation

## 2017-01-02 LAB — COMPREHENSIVE METABOLIC PANEL
ALBUMIN: 3.5 g/dL (ref 3.5–5.0)
ALT: 21 U/L (ref 17–63)
AST: 27 U/L (ref 15–41)
Alkaline Phosphatase: 45 U/L (ref 38–126)
Anion gap: 8 (ref 5–15)
BUN: 17 mg/dL (ref 6–20)
CO2: 30 mmol/L (ref 22–32)
Calcium: 8.5 mg/dL — ABNORMAL LOW (ref 8.9–10.3)
Chloride: 96 mmol/L — ABNORMAL LOW (ref 101–111)
Creatinine, Ser: 1.17 mg/dL (ref 0.61–1.24)
GFR calc Af Amer: >60 mL/min (ref >60)
GFR calc non Af Amer: 58 mL/min — ABNORMAL LOW (ref >60)
Glucose, Bld: 104 mg/dL — ABNORMAL HIGH (ref 65–99)
Potassium: 4.6 mmol/L (ref 3.5–5.1)
SODIUM: 134 mmol/L — AB (ref 135–145)
Total Bilirubin: 1.3 mg/dL — ABNORMAL HIGH (ref 0.3–1.2)
Total Protein: 7.2 g/dL (ref 6.5–8.1)

## 2017-01-02 LAB — CBC WITH DIFFERENTIAL/PLATELET
BASOS ABS: 0 10*3/uL (ref 0.0–0.1)
BASOS PCT: 0 %
EOS ABS: 0.5 10*3/uL (ref 0.0–0.7)
Eosinophils Relative: 3 %
HCT: 47.5 % (ref 39.0–52.0)
HEMOGLOBIN: 15.8 g/dL (ref 13.0–17.0)
Lymphocytes Relative: 17 %
Lymphs Abs: 3 10*3/uL (ref 0.7–4.0)
MCH: 31 pg (ref 26.0–34.0)
MCHC: 33.3 g/dL (ref 30.0–36.0)
MCV: 93.1 fL (ref 78.0–100.0)
MONO ABS: 1.6 10*3/uL — AB (ref 0.1–1.0)
Monocytes Relative: 9 %
NEUTROS PCT: 71 %
Neutro Abs: 12.4 10*3/uL — ABNORMAL HIGH (ref 1.7–7.7)
Platelets: 270 10*3/uL (ref 150–400)
RBC: 5.1 MIL/uL (ref 4.22–5.81)
RDW: 13.4 % (ref 11.5–15.5)
WBC: 17.5 10*3/uL — ABNORMAL HIGH (ref 4.0–10.5)

## 2017-01-02 LAB — I-STAT TROPONIN, ED: Troponin i, poc: 0.01 ng/mL (ref 0.00–0.08)

## 2017-01-02 MED ORDER — ENOXAPARIN SODIUM 40 MG/0.4ML ~~LOC~~ SOLN
40.0000 mg | SUBCUTANEOUS | Status: DC
  Administered 2017-01-02 – 2017-01-03 (×2): 40 mg via SUBCUTANEOUS
  Filled 2017-01-02 (×2): qty 0.4

## 2017-01-02 MED ORDER — ASPIRIN EC 81 MG PO TBEC
81.0000 mg | DELAYED_RELEASE_TABLET | Freq: Every day | ORAL | Status: DC
  Administered 2017-01-03 – 2017-01-04 (×2): 81 mg via ORAL
  Filled 2017-01-02 (×2): qty 1

## 2017-01-02 MED ORDER — TRAMADOL HCL 50 MG PO TABS
50.0000 mg | ORAL_TABLET | Freq: Once | ORAL | Status: AC
  Administered 2017-01-02: 50 mg via ORAL
  Filled 2017-01-02: qty 1

## 2017-01-02 MED ORDER — SIMVASTATIN 20 MG PO TABS
20.0000 mg | ORAL_TABLET | Freq: Every day | ORAL | Status: DC
  Administered 2017-01-02 – 2017-01-03 (×2): 20 mg via ORAL
  Filled 2017-01-02 (×2): qty 1

## 2017-01-02 MED ORDER — AMLODIPINE BESYLATE 5 MG PO TABS
10.0000 mg | ORAL_TABLET | Freq: Every day | ORAL | Status: DC
  Administered 2017-01-02 – 2017-01-04 (×3): 10 mg via ORAL
  Filled 2017-01-02 (×3): qty 2

## 2017-01-02 MED ORDER — DM-GUAIFENESIN ER 30-600 MG PO TB12
1.0000 | ORAL_TABLET | Freq: Two times a day (BID) | ORAL | Status: DC
  Administered 2017-01-03 – 2017-01-04 (×3): 1 via ORAL
  Filled 2017-01-02 (×3): qty 1

## 2017-01-02 MED ORDER — SODIUM CHLORIDE 0.9 % IV BOLUS (SEPSIS)
500.0000 mL | Freq: Once | INTRAVENOUS | Status: AC
  Administered 2017-01-02: 500 mL via INTRAVENOUS

## 2017-01-02 MED ORDER — ALBUTEROL SULFATE (2.5 MG/3ML) 0.083% IN NEBU: 2.5000 mg | INHALATION_SOLUTION | RESPIRATORY_TRACT | Status: DC | PRN

## 2017-01-02 MED ORDER — DOXYCYCLINE HYCLATE 100 MG IV SOLR
INTRAVENOUS | Status: AC
  Filled 2017-01-02: qty 100

## 2017-01-02 MED ORDER — SODIUM CHLORIDE 0.9 % IV SOLN
INTRAVENOUS | Status: AC
  Administered 2017-01-02 (×2): via INTRAVENOUS

## 2017-01-02 MED ORDER — FINASTERIDE 5 MG PO TABS
5.0000 mg | ORAL_TABLET | Freq: Every day | ORAL | Status: DC
  Administered 2017-01-03 – 2017-01-04 (×2): 5 mg via ORAL
  Filled 2017-01-02 (×4): qty 1

## 2017-01-02 MED ORDER — DEXTROSE 5 % IV SOLN
500.0000 mg | Freq: Once | INTRAVENOUS | Status: AC
  Administered 2017-01-02: 500 mg via INTRAVENOUS
  Filled 2017-01-02: qty 500

## 2017-01-02 MED ORDER — HYDROMORPHONE HCL 1 MG/ML IJ SOLN
0.5000 mg | INTRAMUSCULAR | Status: DC | PRN
  Administered 2017-01-03: 0.5 mg via INTRAVENOUS
  Filled 2017-01-02: qty 1

## 2017-01-02 MED ORDER — DOXYCYCLINE HYCLATE 100 MG IV SOLR
100.0000 mg | Freq: Two times a day (BID) | INTRAVENOUS | Status: DC
  Administered 2017-01-02 – 2017-01-04 (×4): 100 mg via INTRAVENOUS
  Filled 2017-01-02 (×6): qty 100

## 2017-01-02 MED ORDER — DEXTROSE 5 % IV SOLN
1.0000 g | Freq: Once | INTRAVENOUS | Status: AC
  Administered 2017-01-02: 1 g via INTRAVENOUS
  Filled 2017-01-02: qty 10

## 2017-01-02 MED ORDER — HYDROCODONE-ACETAMINOPHEN 5-325 MG PO TABS
1.0000 | ORAL_TABLET | ORAL | Status: DC | PRN
  Administered 2017-01-02: 1 via ORAL
  Filled 2017-01-02: qty 1

## 2017-01-02 MED ORDER — ACETAMINOPHEN 325 MG PO TABS: 650.0000 mg | ORAL_TABLET | Freq: Four times a day (QID) | ORAL | Status: DC | PRN

## 2017-01-02 NOTE — Progress Notes (Signed)
Pharmacy Antibiotic Note  Russell Bryant is a 76 y.o. male admitted on 01/02/2017 with pneumonia.  Pharmacy has been consulted for azithromycin and rocephin dosing. Initial doses ordered in the ED  Plan: Cont azithromycin 500 mg IV q24 hours Cont rocephin 1 gm IV q24 hours F/u cultures and clinical course  Height: 5\' 8"  (172.7 cm) Weight: 168 lb (76.2 kg) IBW/kg (Calculated) : 68.4  Temp (24hrs), Avg:98.2 F (36.8 C), Min:98.1 F (36.7 C), Max:98.3 F (36.8 C)   Recent Labs Lab 01/02/17 1317  WBC 17.5*  CREATININE 1.17    Estimated Creatinine Clearance: 51.2 mL/min (by C-G formula based on SCr of 1.17 mg/dL).    No Known Allergies  Antimicrobials this admission: rocephin 1/8 >>  azithromycin 1/8 >>   Thank you for allowing pharmacy to be a part of this patient's care.  Excell Seltzer Poteet 01/02/2017 4:29 PM

## 2017-01-02 NOTE — ED Provider Notes (Signed)
Emergency Department Provider Note   I have reviewed the triage vital signs and the nursing notes.   HISTORY  Chief Complaint Cough   HPI Russell Bryant is a 76 y.o. male with PMH of HLD, HTN, and diverticulosis who presents to the emergency department for evaluation of cough and worsening left-sided chest pain. The patient has had URI symptoms for the last 11 days. He saw his primary care physician early in the course of illness and was prescribed azithromycin and prednisone. No clinical signs or symptoms to suggest pneumonia at that time. He completed the antibiotics and steroids but his symptoms have worsened. 2 days ago he developed severe left-sided chest pain only with coughing. He denies any pain at rest or with normal respiration. He denies any fever, chills, nausea, vomiting, diarrhea. Wife at bedside is not noticed any confusion. She states that he has been more short of breath especially on exertion.    Past Medical History:  Diagnosis Date  . Arthritis   . Diverticulosis 2014   pancolonic per colonoscopy  . H/O hiatal hernia   . Hernia   . Hypercholesteremia   . Hypertension   . Pancreatitis     Patient Active Problem List   Diagnosis Date Noted  . CAP (community acquired pneumonia) 01/02/2017  . Hyponatremia 01/02/2017  . Muscle strain of chest wall 01/02/2017  . Intercostal pain   . Epigastric pain   . Abdominal pain, epigastric 12/31/2014  . Nausea and vomiting 12/31/2014  . History of pancreatitis 12/31/2014  . Pancreatitis 12/31/2014  . Essential hypertension 12/31/2014  . HLD (hyperlipidemia) 12/31/2014  . BPH (benign prostatic hyperplasia) 12/31/2014  . Leukocytosis 12/31/2014  . Acute pancreatitis     Past Surgical History:  Procedure Laterality Date  . CATARACT EXTRACTION W/PHACO  11/05/2012   Procedure: CATARACT EXTRACTION PHACO AND INTRAOCULAR LENS PLACEMENT (IOC);  Surgeon: Tonny Branch, MD;  Location: [facility];  Service: Ophthalmology;   Laterality: Left;  CDE 18.11  . CATARACT EXTRACTION W/PHACO  11/19/2012   Procedure: CATARACT EXTRACTION PHACO AND INTRAOCULAR LENS PLACEMENT (IOC);  Surgeon: Tonny Branch, MD;  Location: [facility];  Service: Ophthalmology;  Laterality: Right;  CDE:  19.10  . COLONOSCOPY N/A 09/04/2013   [id number] diverticulosis  . MASS EXCISION Left 08/28/2014   Procedure: 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;  Surgeon: Daryll Brod, MD;  Location: [facility];  Service: Orthopedics;  Laterality: Left;  Marland Kitchen 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  . ROTATOR CUFF REPAIR     [place]      Allergies Patient has no known allergies.  Family History  Problem Relation Age of Onset  . Heart attack Father     Social History Social History  Substance Use Topics  . Smoking status: Never Smoker  . Smokeless tobacco: Never Used  . Alcohol use No    Review of Systems  Constitutional: No fever/chills Eyes: No visual changes. ENT: No sore throat. Cardiovascular: Positive chest pain with coughing.  Respiratory: Positive shortness of breath with exertion and coughing.  Gastrointestinal: No abdominal pain.  No nausea, no vomiting.  No diarrhea.  No constipation. Genitourinary: Negative for dysuria. Musculoskeletal: Negative for back pain. Skin: Negative for rash. Neurological: Negative for headaches, focal weakness or numbness.  10-point ROS otherwise negative.  ____________________________________________   PHYSICAL EXAM:  VITAL SIGNS: ED Triage Vitals  Enc Vitals Group     BP 01/02/17 1222 155/67     Pulse  Rate 01/02/17 1222 78     Resp 01/02/17 1222 20     Temp 01/02/17 1222 98.3 F (36.8 C)     Temp Source 01/02/17 1222 Oral     SpO2 01/02/17 1222 95 %     Weight 01/02/17 1222 168 lb (76.2 kg)     Height 01/02/17 1222 5\' 8"  (1.727 m)     Pain Score 01/02/17 1223 10   Constitutional: Alert and oriented. Well appearing and in no acute  distress. Eyes: Conjunctivae are normal.  Head: Atraumatic. Nose: No congestion/rhinnorhea. Mouth/Throat: Mucous membranes are moist.  Oropharynx non-erythematous. Neck: No stridor.  Cardiovascular: Normal rate, regular rhythm. Good peripheral circulation. Grossly normal heart sounds.   Respiratory: Normal respiratory effort. No retractions. Lungs with crackles on the left.  Gastrointestinal: Soft and nontender. No distention.  Musculoskeletal: No lower extremity tenderness nor edema. No gross deformities of extremities. Neurologic:  Normal speech and language. No gross focal neurologic deficits are appreciated.  Skin:  Skin is warm, dry and intact. No rash noted.  ____________________________________________   LABS (all labs ordered are listed, but only abnormal results are displayed)  Labs Reviewed  CBC WITH DIFFERENTIAL/PLATELET - Abnormal; Notable for the following:       Result Value   WBC 17.5 (*)    Neutro Abs 12.4 (*)    Monocytes Absolute 1.6 (*)    All other components within normal limits  COMPREHENSIVE METABOLIC PANEL - Abnormal; Notable for the following:    Sodium 134 (*)    Chloride 96 (*)    Glucose, Bld 104 (*)    Calcium 8.5 (*)    Total Bilirubin 1.3 (*)    GFR calc non Af Amer 58 (*)    All other components within normal limits  BASIC METABOLIC PANEL - Abnormal; Notable for the following:    Sodium 134 (*)    Chloride 99 (*)    Calcium 8.0 (*)    All other components within normal limits  CBC - Abnormal; Notable for the following:    WBC 12.9 (*)    All other components within normal limits  CULTURE, BLOOD (ROUTINE X 2)  CULTURE, BLOOD (ROUTINE X 2)  CULTURE, EXPECTORATED SPUTUM-ASSESSMENT  GRAM STAIN  HIV ANTIBODY (ROUTINE TESTING)  STREP PNEUMONIAE URINARY ANTIGEN  I-STAT TROPOININ, ED   ____________________________________________  EKG   EKG Interpretation  Date/Time:  Monday January 02 2017 17:05:38 EST Ventricular Rate:  68 PR  Interval:    QRS Duration: 83 QT Interval:  407 QTC Calculation: 433 R Axis:   59 Text Interpretation:  Sinus rhythm Consider left atrial enlargement Baseline wander in lead(s) II No STEMI.  Confirmed by LONG MD, JOSHUA [phone]) on 01/02/2017 5:08:48 PM       ____________________________________________  RADIOLOGY  Dg Chest 2 View  Result Date: 01/02/2017 CLINICAL DATA:  Cough and chest congestion for the past week. No improvement on prednisone and antibiotics. Pleuritic pain with coughing. EXAM: CHEST  2 VIEW COMPARISON:  Chest x-ray of December 28, 2007 FINDINGS: There is infiltrate in the lingula. The remainder of the left lung is clear. The right lung is clear. There is no significant pleural effusion. The heart and pulmonary vascularity are normal. There is calcification in the wall of the aortic arch. The trachea is midline. The bony thorax exhibits no acute abnormality. IMPRESSION: Lingular infiltrate most compatible with pneumonia. If the patient's symptoms do not improve with the current antibiotics or with a subsequent regimen, chest CT scanning  would be a useful next imaging step to exclude underlying malignancy. Electronically Signed   By: David  Martinique M.D.   On: 01/02/2017 12:43    ____________________________________________   PROCEDURES  Procedure(s) performed:   Procedures  None ____________________________________________   INITIAL IMPRESSION / ASSESSMENT AND PLAN / ED COURSE  Pertinent labs & imaging results that were available during my care of the patient were reviewed by me and considered in my medical decision making (see chart for details).  Patient resents to the emergency department for evaluation of cough and left sided chest pain. His pain is only with coughing. He has a left lingular pneumonia on chest x-ray here along with leukocytosis. His oxygen is 91% on room air with good waveform. Wife describes significant exertional dyspnea. He was given  azithromycin early in the course of illness is not improved. Very low suspicion for acute coronary syndrome with chest pain only with coughing and pneumonia visualized on chest x-ray. Plan for antibiotics, IV fluids, oxygen, and likely admission.   05:46 PM Troponin and EKG non-concerning. Will page Hospitalist for admission.   Discussed patient's case with hospitalist, Dr. Myna Hidalgo.  Recommend admission to med-surg, observation bed.  I will place holding orders per their request. Patient and family (if present) updated with plan. Care transferred to hospitalist service.  I reviewed all nursing notes, vitals, pertinent old records, EKGs, labs, imaging (as available).  ____________________________________________  FINAL CLINICAL IMPRESSION(S) / ED DIAGNOSES  Final diagnoses:  Community acquired pneumonia of left lung, unspecified part of lung  Intercostal pain     MEDICATIONS GIVEN DURING THIS VISIT:  Medications  azithromycin (ZITHROMAX) 500 mg in dextrose 5 % 250 mL IVPB (not administered)  cefTRIAXone (ROCEPHIN) 1 g in dextrose 5 % 50 mL IVPB (not administered)  simvastatin (ZOCOR) tablet 20 mg (20 mg Oral Given 01/02/17 2314)  amLODipine (NORVASC) tablet 10 mg (10 mg Oral Given 01/02/17 2015)  finasteride (PROSCAR) tablet 5 mg (not administered)  aspirin EC tablet 81 mg (not administered)  enoxaparin (LOVENOX) injection 40 mg (40 mg Subcutaneous Given 01/02/17 2314)  0.9 %  sodium chloride infusion ( Intravenous New Bag/Given 01/02/17 2313)  doxycycline (VIBRAMYCIN) 100 mg in dextrose 5 % 250 mL IVPB (100 mg Intravenous Transfusing/Transfer 01/02/17 2212)  HYDROmorphone (DILAUDID) injection 0.5 mg (not administered)  acetaminophen (TYLENOL) tablet 650 mg (not administered)  albuterol (PROVENTIL) (2.5 MG/3ML) 0.083% nebulizer solution 2.5 mg (not administered)  dextromethorphan-guaiFENesin (MUCINEX DM) 30-600 MG per 12 hr tablet 1 tablet (1 tablet Oral Not Given 01/03/17 0033)    methylPREDNISolone sodium succinate (SOLU-MEDROL) 125 mg/2 mL injection 125 mg (not administered)  ipratropium-albuterol (DUONEB) 0.5-2.5 (3) MG/3ML nebulizer solution 3 mL (not administered)  HYDROcodone-acetaminophen (NORCO/VICODIN) 5-325 MG per tablet 1 tablet (not administered)  pantoprazole (PROTONIX) EC tablet 40 mg (not administered)  sodium chloride 0.9 % bolus 500 mL (0 mLs Intravenous Stopped 01/02/17 1736)  cefTRIAXone (ROCEPHIN) 1 g in dextrose 5 % 50 mL IVPB (0 g Intravenous Stopped 01/02/17 1737)  azithromycin (ZITHROMAX) 500 mg in dextrose 5 % 250 mL IVPB (0 mg Intravenous Stopped 01/02/17 1844)  traMADol (ULTRAM) tablet 50 mg (50 mg Oral Given 01/02/17 1705)     NEW OUTPATIENT MEDICATIONS STARTED DURING THIS VISIT:  None  Note:  This document was prepared using Dragon voice recognition software and may include unintentional dictation errors.  Nanda Quinton, MD Emergency Medicine   Margette Fast, MD 01/03/17 [phone]

## 2017-01-02 NOTE — ED Triage Notes (Signed)
PT states recent antibiotic completion for dx of URI and states no relief from medications. PT states continued pressure behind ears, nasal congestion and cough that is non-productive and pain to left rib cage with cough.

## 2017-01-02 NOTE — H&P (Signed)
History and Physical    Russell Bryant [id number] DOB: Nov 08, 1941 DOA: 01/02/2017  PCP: Rocky Morel, MD   Patient coming from: Home  Chief Complaint: Cough, dyspnea, pain with cough  HPI: Russell Bryant is a 76 y.o. male with medical history significant for hypertension and hyperlipidemia who typically enjoys good health, but now presents to the ED for evaluation of cough, dyspnea, and pain with cough. Patient reports the development of sore throat, cough, and generalized aches and malaise approximately 10 days ago. Sore throat and aches began to improve, but the cough persisted and he also developed dyspnea. He was evaluated by his primary care physician and given a course of azithromycin and prednisone, but his condition is continued to worsen despite this. In addition, he has developed severe pain in the left lower chest wall with cough or deep inspiration. He denies recent long distance travel or sick contacts, and denies lower extremity swelling, orthopnea, hemoptysis, chest pain, or palpitations.   ED Course: Upon arrival to the ED, patient is found to be afebrile, saturating adequately on room air, and with vital signs stable. EKG features a sinus rhythm and chest x-ray is notable for a lingular infiltrate. Chemistry panel reveals a mild hyponatremia and hypochloremia, and CBC is remarkable for a leukocytosis to 17,500. Troponin is within normal limits at 0.01. Blood cultures were obtained, a 500 mL normal saline bolus was administered, and empiric antibiotics were started with Rocephin and azithromycin. The patient was given tramadol for his pain with coughing. He remained hemodynamically stable in the ED, but continued to be in some mild respiratory distress while at rest and will be observed on the medical-surgical unit for ongoing evaluation and management of suspected community-acquired pneumonia with musculotendinous strain involving the lower left chest wall.  Review of  Systems:  All other systems reviewed and apart from HPI, are negative.  Past Medical History:  Diagnosis Date  . Arthritis   . Diverticulosis 2014   pancolonic per colonoscopy  . H/O hiatal hernia   . Hernia   . Hypercholesteremia   . Hypertension   . Pancreatitis     Past Surgical History:  Procedure Laterality Date  . CATARACT EXTRACTION W/PHACO  11/05/2012   Procedure: CATARACT EXTRACTION PHACO AND INTRAOCULAR LENS PLACEMENT (IOC);  Surgeon: Tonny Branch, MD;  Location: [facility];  Service: Ophthalmology;  Laterality: Left;  CDE 18.11  . CATARACT EXTRACTION W/PHACO  11/19/2012   Procedure: CATARACT EXTRACTION PHACO AND INTRAOCULAR LENS PLACEMENT (IOC);  Surgeon: Tonny Branch, MD;  Location: [facility];  Service: Ophthalmology;  Laterality: Right;  CDE:  19.10  . COLONOSCOPY N/A 09/04/2013   [id number] diverticulosis  . MASS EXCISION Left 08/28/2014   Procedure: 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;  Surgeon: Daryll Brod, MD;  Location: [facility];  Service: Orthopedics;  Laterality: Left;  Marland Kitchen 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  . ROTATOR CUFF REPAIR     [place]     reports that he has never smoked. He has never used smokeless tobacco. He reports that he does not drink alcohol or use drugs.  No Known Allergies  History reviewed. No pertinent family history.   Prior to Admission medications   Medication Sig Start Date End Date Taking? Authorizing Provider  amLODipine (NORVASC) 10 MG tablet Take 1 tablet (10 mg total) by mouth daily. RESTART AMLODIPINE IN 3 DAYS. Patient taking differently: Take 10 mg by mouth daily.  01/03/15  Yes  Rexene Alberts, MD  aspirin EC 81 MG tablet Take 81 mg by mouth daily.     Yes Historical Provider, MD  benazepril (LOTENSIN) 20 MG tablet Take 20 mg by mouth daily.   Yes Historical Provider, MD  finasteride (PROSCAR) 5 MG tablet Take 5 mg by mouth daily.   Yes Historical Provider, MD  simvastatin  (ZOCOR) 20 MG tablet Take 20 mg by mouth at bedtime. 10/21/16  Yes Historical Provider, MD  azithromycin (ZITHROMAX) 250 MG tablet Take 250-500 mg by mouth See admin instructions. Starting on 12/23/2016 take two tablets on day 1 then take one tablet on days 2 through 5 12/23/16   Historical Provider, MD  predniSONE (DELTASONE) 10 MG tablet Take 10 mg by mouth as directed.  12/23/16   Historical Provider, MD    Physical Exam: Vitals:   01/02/17 1730 01/02/17 1815 01/02/17 1830 01/02/17 1845  BP: 134/71  135/74   Pulse: 66 70 70 69  Resp:  19 19 22   Temp:      TempSrc:      SpO2:  95% 94% 95%  Weight:      Height:          Constitutional: Dyspneic with full sentences, no pallor, no diaphoresis Eyes: PERTLA, lids and conjunctivae normal ENMT: Mucous membranes are moist. Posterior pharynx clear of any exudate or lesions.   Neck: normal, supple, no masses, no thyromegaly Respiratory: Rhonchi at bilateral mid-lung zones. Mild tachypnea. No accessory muscle use.  Cardiovascular: S1 & S2 heard, regular rate and rhythm. No extremity edema. No significant JVD. Abdomen: No distension, no tenderness, no masses palpated. Bowel sounds normal.  Musculoskeletal: no clubbing / cyanosis. Tender at the lower costal margin at left midclavicular line. Normal muscle tone.  Skin: no significant rashes, lesions, ulcers. Warm, dry, well-perfused. Neurologic: CN 2-12 grossly intact. Sensation intact, DTR normal. Strength 5/5 in all 4 limbs.  Psychiatric: Normal judgment and insight. Alert and oriented x 3. Normal mood and affect.     Labs on Admission: I have personally reviewed following labs and imaging studies  CBC:  Recent Labs Lab 01/02/17 1317  WBC 17.5*  NEUTROABS 12.4*  HGB 15.8  HCT 47.5  MCV 93.1  PLT [id number]   Basic Metabolic Panel:  Recent Labs Lab 01/02/17 1317  NA 134*  K 4.6  CL 96*  CO2 30  GLUCOSE 104*  BUN 17  CREATININE 1.17  CALCIUM 8.5*   GFR: Estimated  Creatinine Clearance: 51.2 mL/min (by C-G formula based on SCr of 1.17 mg/dL). Liver Function Tests:  Recent Labs Lab 01/02/17 1317  AST 27  ALT 21  ALKPHOS 45  BILITOT 1.3*  PROT 7.2  ALBUMIN 3.5   No results for input(s): LIPASE, AMYLASE in the last 168 hours. No results for input(s): AMMONIA in the last 168 hours. Coagulation Profile: No results for input(s): INR, PROTIME in the last 168 hours. Cardiac Enzymes: No results for input(s): CKTOTAL, CKMB, CKMBINDEX, TROPONINI in the last 168 hours. BNP (last 3 results) No results for input(s): PROBNP in the last 8760 hours. HbA1C: No results for input(s): HGBA1C in the last 72 hours. CBG: No results for input(s): GLUCAP in the last 168 hours. Lipid Profile: No results for input(s): CHOL, HDL, LDLCALC, TRIG, CHOLHDL, LDLDIRECT in the last 72 hours. Thyroid Function Tests: No results for input(s): TSH, T4TOTAL, FREET4, T3FREE, THYROIDAB in the last 72 hours. Anemia Panel: No results for input(s): VITAMINB12, FOLATE, FERRITIN, TIBC, IRON, RETICCTPCT in the last 72  hours. Urine analysis:    Component Value Date/Time   COLORURINE YELLOW 12/31/2014 1245   APPEARANCEUR CLEAR 12/31/2014 1245   LABSPEC >1.030 (H) 12/31/2014 1245   PHURINE 5.5 12/31/2014 1245   GLUCOSEU NEGATIVE 12/31/2014 1245   HGBUR MODERATE (A) 12/31/2014 1245   BILIRUBINUR SMALL (A) 12/31/2014 1245   KETONESUR 40 (A) 12/31/2014 1245   PROTEINUR 100 (A) 12/31/2014 1245   UROBILINOGEN 0.2 12/31/2014 1245   NITRITE NEGATIVE 12/31/2014 1245   LEUKOCYTESUR NEGATIVE 12/31/2014 1245   Sepsis Labs: @LABRCNTIP (procalcitonin:4,lacticidven:4) ) Recent Results (from the past 240 hour(s))  Culture, blood (routine x 2)     Status: None (Preliminary result)   Collection Time: 01/02/17  4:26 PM  Result Value Ref Range Status   Specimen Description LEFT ANTECUBITAL  Final   Special Requests BOTTLES DRAWN AEROBIC AND ANAEROBIC 6CC  Final   Culture PENDING  Incomplete     Report Status PENDING  Incomplete  Culture, blood (routine x 2)     Status: None (Preliminary result)   Collection Time: 01/02/17  4:31 PM  Result Value Ref Range Status   Specimen Description BLOOD LEFT WRIST  Final   Special Requests BOTTLES DRAWN AEROBIC AND ANAEROBIC 6CC  Final   Culture PENDING  Incomplete   Report Status PENDING  Incomplete     Radiological Exams on Admission: Dg Chest 2 View  Result Date: 01/02/2017 CLINICAL DATA:  Cough and chest congestion for the past week. No improvement on prednisone and antibiotics. Pleuritic pain with coughing. EXAM: CHEST  2 VIEW COMPARISON:  Chest x-ray of December 28, 2007 FINDINGS: There is infiltrate in the lingula. The remainder of the left lung is clear. The right lung is clear. There is no significant pleural effusion. The heart and pulmonary vascularity are normal. There is calcification in the wall of the aortic arch. The trachea is midline. The bony thorax exhibits no acute abnormality. IMPRESSION: Lingular infiltrate most compatible with pneumonia. If the patient's symptoms do not improve with the current antibiotics or with a subsequent regimen, chest CT scanning would be a useful next imaging step to exclude underlying malignancy. Electronically Signed   By: David  Martinique M.D.   On: 01/02/2017 12:43    EKG: Independently reviewed. Sinus rhythm.  Assessment/Plan  1. CAP  - Pt presents with cough, dyspnea, found to have CAP with left-sided infiltrate  - Blood cultures obtained in ED, sputum cultures strep pneumo urine antigen pending - Observe in hospital given age and associated chest-wall MSK strain that makes it difficult for him to cough or take deep breath - Continue empiric abx with Rocephin and doxycycline as he just completed Z-pak  - Supportive care with supplemental O2, gentle IVF hydration, mucolytics, analgesia as needed  2. Musculotendinous strain of chest wall   - Pt has been coughing since late December with likely  viral URI and now PNA - He has developed pain in the lower left chest wall with cough or deep breath  - No fracture identified on CXR and no crepitus or bruising on exam  - Pain-control, incentive spirometry  3. Hyponatremia  - Mild hyponatremia/hypochloremia on admission in setting of dehydration  - Given a 500 cc NS bolus in ED and continued on gentle IVF hydration  - Repeat chem panel in am   4. Hypertension  - At goal in ED  - Continue Norvasc as tolerated; benazepril held on admission given dehydration and concern for precipitating kidney injury    DVT prophylaxis: sq  Lovenox  Code Status: Full  Family Communication: Son and wife updated at bedside at patient's request Disposition Plan: Observe on med-surg Consults called: None Admission status: Observation    Vianne Bulls, MD [facility] Pager [phone]  If 7PM-7AM, please contact night-coverage [email or web address] Password [email or web address]  01/02/2017, 7:20 PM

## 2017-01-03 DIAGNOSIS — S29011D Strain of muscle and tendon of front wall of thorax, subsequent encounter: Secondary | ICD-10-CM

## 2017-01-03 DIAGNOSIS — I10 Essential (primary) hypertension: Secondary | ICD-10-CM | POA: Diagnosis not present

## 2017-01-03 DIAGNOSIS — R0782 Intercostal pain: Secondary | ICD-10-CM | POA: Diagnosis not present

## 2017-01-03 DIAGNOSIS — E871 Hypo-osmolality and hyponatremia: Secondary | ICD-10-CM | POA: Diagnosis not present

## 2017-01-03 DIAGNOSIS — J181 Lobar pneumonia, unspecified organism: Secondary | ICD-10-CM | POA: Diagnosis not present

## 2017-01-03 LAB — HIV ANTIBODY (ROUTINE TESTING W REFLEX): HIV SCREEN 4TH GENERATION: NONREACTIVE

## 2017-01-03 LAB — BASIC METABOLIC PANEL
Anion gap: 5 (ref 5–15)
BUN: 13 mg/dL (ref 6–20)
CALCIUM: 8 mg/dL — AB (ref 8.9–10.3)
CO2: 30 mmol/L (ref 22–32)
CREATININE: 0.88 mg/dL (ref 0.61–1.24)
Chloride: 99 mmol/L — ABNORMAL LOW (ref 101–111)
GFR calc non Af Amer: >60 mL/min (ref >60)
Glucose, Bld: 86 mg/dL (ref 65–99)
Potassium: 4.1 mmol/L (ref 3.5–5.1)
SODIUM: 134 mmol/L — AB (ref 135–145)

## 2017-01-03 LAB — CBC
HCT: 43.2 % (ref 39.0–52.0)
Hemoglobin: 14.6 g/dL (ref 13.0–17.0)
MCH: 31.2 pg (ref 26.0–34.0)
MCHC: 33.8 g/dL (ref 30.0–36.0)
MCV: 92.3 fL (ref 78.0–100.0)
PLATELETS: 265 10*3/uL (ref 150–400)
RBC: 4.68 MIL/uL (ref 4.22–5.81)
RDW: 13.5 % (ref 11.5–15.5)
WBC: 12.9 10*3/uL — ABNORMAL HIGH (ref 4.0–10.5)

## 2017-01-03 LAB — STREP PNEUMONIAE URINARY ANTIGEN: Strep Pneumo Urinary Antigen: NEGATIVE

## 2017-01-03 MED ORDER — DEXTROSE 5 % IV SOLN
1.0000 g | INTRAVENOUS | Status: DC
  Filled 2017-01-03: qty 10

## 2017-01-03 MED ORDER — METHYLPREDNISOLONE SODIUM SUCC 125 MG IJ SOLR
125.0000 mg | Freq: Once | INTRAMUSCULAR | Status: AC
  Administered 2017-01-03: 125 mg via INTRAVENOUS
  Filled 2017-01-03: qty 2

## 2017-01-03 MED ORDER — PANTOPRAZOLE SODIUM 40 MG PO TBEC
40.0000 mg | DELAYED_RELEASE_TABLET | Freq: Every day | ORAL | Status: DC
  Administered 2017-01-03 – 2017-01-04 (×2): 40 mg via ORAL
  Filled 2017-01-03: qty 1

## 2017-01-03 MED ORDER — HYDROCODONE-ACETAMINOPHEN 5-325 MG PO TABS
1.0000 | ORAL_TABLET | Freq: Four times a day (QID) | ORAL | Status: DC | PRN
  Administered 2017-01-03: 1 via ORAL
  Filled 2017-01-03: qty 1

## 2017-01-03 MED ORDER — IPRATROPIUM-ALBUTEROL 0.5-2.5 (3) MG/3ML IN SOLN
3.0000 mL | Freq: Four times a day (QID) | RESPIRATORY_TRACT | Status: AC
  Administered 2017-01-03 – 2017-01-04 (×4): 3 mL via RESPIRATORY_TRACT
  Filled 2017-01-03 (×4): qty 3

## 2017-01-03 MED ORDER — DEXTROSE 5 % IV SOLN
500.0000 mg | INTRAVENOUS | Status: DC
  Filled 2017-01-03: qty 500

## 2017-01-03 MED ORDER — IPRATROPIUM-ALBUTEROL 0.5-2.5 (3) MG/3ML IN SOLN: 3.0000 mL | Freq: Four times a day (QID) | RESPIRATORY_TRACT | Status: DC

## 2017-01-03 NOTE — Care Management Obs Status (Signed)
[facility] NOTIFICATION   Patient Details  Name: CORRIE TOMINAGA MRN: [id number] Date of Birth: 05-Jan-1941   Medicare Observation Status Notification Given:  Yes    Pamela Maddy, Chauncey Reading, RN 01/03/2017, 9:53 AM

## 2017-01-03 NOTE — Progress Notes (Addendum)
PROGRESS NOTE    Russell Bryant  [id number]  DOB: Oct 15, 1941  DOA: 01/02/2017 PCP: Rocky Morel, MD  Hospital course: 76 y.o. male with medical history significant for hypertension and hyperlipidemia who typically enjoys good health, but now presents to the ED for evaluation of cough, dyspnea, and pain with cough. Patient reports the development of sore throat, cough, and generalized aches and malaise approximately 10 days ago. Sore throat and aches began to improve, but the cough persisted and he also developed dyspnea. He was evaluated by his primary care physician and given a course of azithromycin and prednisone, but his condition is continued to worsen despite this. In addition, he has developed severe pain in the left lower chest wall with cough or deep inspiration. He denies recent long distance travel or sick contacts, and denies lower extremity swelling, orthopnea, hemoptysis, chest pain, or palpitations.   Assessment & Plan:   1. CAP  - Pt presents with cough, dyspnea, found to have CAP with left-sided infiltrate  - Blood cultures obtained in ED, sputum cultures strep pneumo urine antigen pending - Observe in hospital given age and associated chest-wall MSK strain that makes it difficult for him to cough or take deep breath, suspect this is pleuritic pain, ordered solumedrol 125 mg x 1 dose.  - Continue  Doxycycline, likely can switch to oral on 1/10  - Supportive care with supplemental O2, gentle IVF hydration, mucolytics, analgesia as needed - repeat CXR in morning 1/10  2. Pleuritic chest wall  pain - Pt has been coughing since late December with likely viral URI and now PNA - He has developed pain in the lower left chest wall with cough or deep breath  - No fracture identified on CXR and no crepitus or bruising on exam  - Pain-control, incentive spirometry - Solumedrol 125 mg IV x 1 dose given 1/9.   3. Hyponatremia  - Mild hyponatremia/hypochloremia on  admission in setting of dehydration  - Given a 500 cc NS bolus in ED and continued on gentle IVF hydration  - Repeat chem panel in am   4. Hypertension  - At goal in ED  - Continue Norvasc as tolerated; benazepril held on admission given dehydration and concern for precipitating kidney injury   DVT prophylaxis: sq Lovenox  Code Status: Full  Family Communication: Son and wife updated at patient's request Disposition Plan: Observe on med-surg Consults called: None Admission status: Possible DC home 1/10  Subjective: Pt says he has left chest wall pain with deep breaths and coughing.   Objective: Vitals:   01/03/17 0537 01/03/17 0801 01/03/17 1432 01/03/17 1450  BP: 136/79   (!) 155/76  Pulse: 66   86  Resp: 20   20  Temp: 98.9 F (37.2 C)   98.4 F (36.9 C)  TempSrc: Oral   Oral  SpO2: 97% 94% 92% 92%  Weight:      Height:        Intake/Output Summary (Last 24 hours) at 01/03/17 1509 Last data filed at 01/03/17 0830  Gross per 24 hour  Intake          2363.33 ml  Output             1650 ml  Net           713.33 ml   Filed Weights   01/02/17 1222 01/02/17 2230  Weight: 76.2 kg (168 lb) 73.6 kg (162 lb 3.2 oz)    Exam:  General exam:  awake, alert NaD, sitting up in bed.  Respiratory system: shallow BS bilateral,  No increased work of breathing. Insp/exp wheezing.  Cardiovascular system: S1 & S2 heard. No JVD, murmurs, gallops, clicks or pedal edema. Gastrointestinal system: Abdomen is nondistended, soft and nontender. Normal bowel sounds heard. Central nervous system: Alert and oriented. No focal neurological deficits. Extremities: no cyanosis.  Data Reviewed: Basic Metabolic Panel:  Recent Labs Lab 01/02/17 1317 01/03/17 0456  NA 134* 134*  K 4.6 4.1  CL 96* 99*  CO2 30 30  GLUCOSE 104* 86  BUN 17 13  CREATININE 1.17 0.88  CALCIUM 8.5* 8.0*   Liver Function Tests:  Recent Labs Lab 01/02/17 1317  AST 27  ALT 21  ALKPHOS 45  BILITOT 1.3*    PROT 7.2  ALBUMIN 3.5   No results for input(s): LIPASE, AMYLASE in the last 168 hours. No results for input(s): AMMONIA in the last 168 hours. CBC:  Recent Labs Lab 01/02/17 1317 01/03/17 0456  WBC 17.5* 12.9*  NEUTROABS 12.4*  --   HGB 15.8 14.6  HCT 47.5 43.2  MCV 93.1 92.3  PLT 270 265   Cardiac Enzymes: No results for input(s): CKTOTAL, CKMB, CKMBINDEX, TROPONINI in the last 168 hours. CBG (last 3)  No results for input(s): GLUCAP in the last 72 hours. Recent Results (from the past 240 hour(s))  Culture, blood (routine x 2)     Status: None (Preliminary result)   Collection Time: 01/02/17  4:26 PM  Result Value Ref Range Status   Specimen Description LEFT ANTECUBITAL  Final   Special Requests BOTTLES DRAWN AEROBIC AND ANAEROBIC 6CC  Final   Culture NO GROWTH < 24 HOURS  Final   Report Status PENDING  Incomplete  Culture, blood (routine x 2)     Status: None (Preliminary result)   Collection Time: 01/02/17  4:31 PM  Result Value Ref Range Status   Specimen Description BLOOD LEFT WRIST  Final   Special Requests BOTTLES DRAWN AEROBIC AND ANAEROBIC 6CC  Final   Culture NO GROWTH < 24 HOURS  Final   Report Status PENDING  Incomplete     Studies: Dg Chest 2 View  Result Date: 01/02/2017 CLINICAL DATA:  Cough and chest congestion for the past week. No improvement on prednisone and antibiotics. Pleuritic pain with coughing. EXAM: CHEST  2 VIEW COMPARISON:  Chest x-ray of December 28, 2007 FINDINGS: There is infiltrate in the lingula. The remainder of the left lung is clear. The right lung is clear. There is no significant pleural effusion. The heart and pulmonary vascularity are normal. There is calcification in the wall of the aortic arch. The trachea is midline. The bony thorax exhibits no acute abnormality. IMPRESSION: Lingular infiltrate most compatible with pneumonia. If the patient's symptoms do not improve with the current antibiotics or with a subsequent regimen, chest  CT scanning would be a useful next imaging step to exclude underlying malignancy. Electronically Signed   By: David  Martinique M.D.   On: 01/02/2017 12:43   Scheduled Meds: . amLODipine  10 mg Oral Daily  . aspirin EC  81 mg Oral Daily  . azithromycin  500 mg Intravenous Q24H  . cefTRIAXone (ROCEPHIN)  IV  1 g Intravenous Q24H  . dextromethorphan-guaiFENesin  1 tablet Oral BID  . doxycycline (VIBRAMYCIN) IV  100 mg Intravenous Q12H  . enoxaparin (LOVENOX) injection  40 mg Subcutaneous Q24H  . finasteride  5 mg Oral Daily  . ipratropium-albuterol  3 mL Nebulization  Q6H  . pantoprazole  40 mg Oral Q0600  . simvastatin  20 mg Oral QHS   Continuous Infusions:  Principal Problem:   CAP (community acquired pneumonia) Active Problems:   Essential hypertension   Hyponatremia   Muscle strain of chest wall   Intercostal pain  Time spent:   Irwin Brakeman, MD, FAAFP [facility] Pager [phone]  If 7PM-7AM, please contact night-coverage [email or web address] Password [email or web address] 01/03/2017, 3:09 PM    LOS: 0 days

## 2017-01-03 NOTE — Care Management Note (Signed)
Case Management Note  Patient Details  Name: Russell Bryant MRN: [id number] Date of Birth: [birth year]  Subjective/Objective:  Patient adm from home with CAP. Ind with ADL's. No HH/DME PTA.                 Action/Plan: Anticipate DC home with self care.   Expected Discharge Date:     01/04/2017             Expected Discharge Plan:  Home/Self Care  In-House Referral:  NA  Discharge planning Services  CM Consult  Post Acute Care Choice:    Choice offered to:     DME Arranged:    DME Agency:     HH Arranged:    HH Agency:     Status of Service:  Completed, signed off  If discussed at [employer] of Stay Meetings, dates discussed:    Additional Comments:  Russell Bryant, Russell Reading, RN 01/03/2017, 9:52 AM

## 2017-01-04 DIAGNOSIS — J189 Pneumonia, unspecified organism: Secondary | ICD-10-CM | POA: Diagnosis not present

## 2017-01-04 DIAGNOSIS — E871 Hypo-osmolality and hyponatremia: Secondary | ICD-10-CM | POA: Diagnosis not present

## 2017-01-04 DIAGNOSIS — I10 Essential (primary) hypertension: Secondary | ICD-10-CM | POA: Diagnosis not present

## 2017-01-04 DIAGNOSIS — R0782 Intercostal pain: Secondary | ICD-10-CM | POA: Diagnosis not present

## 2017-01-04 LAB — BASIC METABOLIC PANEL
ANION GAP: 9 (ref 5–15)
BUN: 18 mg/dL (ref 6–20)
CO2: 27 mmol/L (ref 22–32)
Calcium: 8.4 mg/dL — ABNORMAL LOW (ref 8.9–10.3)
Chloride: 97 mmol/L — ABNORMAL LOW (ref 101–111)
Creatinine, Ser: 0.95 mg/dL (ref 0.61–1.24)
Glucose, Bld: 142 mg/dL — ABNORMAL HIGH (ref 65–99)
POTASSIUM: 4.2 mmol/L (ref 3.5–5.1)
SODIUM: 133 mmol/L — AB (ref 135–145)

## 2017-01-04 MED ORDER — DOXYCYCLINE HYCLATE 100 MG PO CAPS: 100.0000 mg | ORAL_CAPSULE | Freq: Two times a day (BID) | ORAL | 0 refills | Status: DC

## 2017-01-04 MED ORDER — BENZONATATE 100 MG PO CAPS: 100.0000 mg | ORAL_CAPSULE | Freq: Three times a day (TID) | ORAL | 0 refills | Status: DC | PRN

## 2017-01-04 NOTE — Progress Notes (Signed)
Patient is comfortable and family at bedside.

## 2017-01-04 NOTE — Progress Notes (Signed)
Patient given water and ice chips. Introduction of self.

## 2017-01-04 NOTE — Progress Notes (Signed)
D/C instructions, hard Rxs and paperwork given to patient and patient's wife. IV catheter removed, no s/s of infiltration/infection noted at this time. Wife at bedside to transport patient home.

## 2017-01-04 NOTE — Discharge Summary (Signed)
Physician Discharge Summary  DUGLAS DADO [id number] DOB: Jul 06, 1941 DOA: 01/02/2017  PCP: Rocky Morel, MD  Admit date: 01/02/2017 Discharge date: 01/04/2017  Time spent: Greater than 30 minutes  Recommendations for Outpatient Follow-up:  1. None   Discharge Diagnoses:  Principal Problem:   Community acquired pneumonia of left lung Active Problems:   Essential hypertension   Hyponatremia   Muscle strain of chest wall   Intercostal pain    Discharge Condition: Improved  Diet recommendation: heart healthy  Filed Weights   01/02/17 1222 01/02/17 2230  Weight: 76.2 kg (168 lb) 73.6 kg (162 lb 3.2 oz)    History of present illness:  Patient is a 76 year old man with a history of hypertension, who presented to the ED on 01/03/2016 with a chief complaint of shortness of breath and cough. He had been treated in the outpatient setting with azithromycin and prednisone by his PCP, but his symptoms did not improve. In the ED, he was shown to have a left-sided infiltrate, consistent with pneumonia. He was admitted for further evaluation and management.  Hospital Course:  Patient was given 125 mg of Solu-Medrol 1 in the ED. He was started on Rocephin, azithromycin, and doxycycline. Gentle IV fluids, as needed oxygen, and mucolytic's were given. Analgesics were given as needed for pleuritic chest pain. Amlodipine was continued for his hypertension, but benazepril was withheld due to his soft blood pressures. Patient's symptoms subsided, but he continued to have a nagging cough. His white blood cell count which was 17.5 on admission improved to 12.9 at the time of discharge. He remained afebrile. Strep pneumo antigen was negative. HIV was nonreactive. Blood cultures were negative to date. He was discharged on Tessalon Perles for cough and 6 more days of doxycycline. He was in improved condition at the time of discharge.  Procedures:  None  Consultations:  None  Discharge  Exam: Vitals:   01/03/17 2123 01/04/17 0536  BP: (!) 141/67 139/64  Pulse:  76  Resp: 20 17  Temp: 98.5 F (36.9 C) 98.2 F (36.8 C)    General: Pleasant 76 year old man in no acute distress. Cardiovascular: S1, S2, no murmurs rubs or gallops. Respiratory: faint/occasional crackles on the left; no splinting.  Discharge Instructions   Discharge Instructions    Diet - low sodium heart healthy    Complete by:  As directed    Increase activity slowly    Complete by:  As directed      Current Discharge Medication List    START taking these medications   Details  benzonatate (TESSALON) 100 MG capsule Take 1 capsule (100 mg total) by mouth 3 (three) times daily as needed for cough. Qty: 20 capsule, Refills: 0    doxycycline (VIBRAMYCIN) 100 MG capsule Take 1 capsule (100 mg total) by mouth 2 (two) times daily. Antibiotic to be taken until completed. Qty: 12 capsule, Refills: 0      CONTINUE these medications which have NOT CHANGED   Details  amLODipine (NORVASC) 10 MG tablet Take 1 tablet (10 mg total) by mouth daily. RESTART AMLODIPINE IN 3 DAYS.    aspirin EC 81 MG tablet Take 81 mg by mouth daily.      benazepril (LOTENSIN) 20 MG tablet Take 20 mg by mouth daily.    finasteride (PROSCAR) 5 MG tablet Take 5 mg by mouth daily.    simvastatin (ZOCOR) 20 MG tablet Take 20 mg by mouth at bedtime.      STOP taking these medications  azithromycin (ZITHROMAX) 250 MG tablet      predniSONE (DELTASONE) 10 MG tablet        No Known Allergies Follow-up Information    FUSCO,LAWRENCE J., MD. Schedule an appointment as soon as possible for a visit in 2 week(s).   Specialty:  Internal Medicine Contact information: [address] [place]  [phone]            The results of significant diagnostics from this hospitalization (including imaging, microbiology, ancillary and laboratory) are listed below for reference.    Significant Diagnostic  Studies: Dg Chest 2 View  Result Date: 01/02/2017 CLINICAL DATA:  Cough and chest congestion for the past week. No improvement on prednisone and antibiotics. Pleuritic pain with coughing. EXAM: CHEST  2 VIEW COMPARISON:  Chest x-ray of December 28, 2007 FINDINGS: There is infiltrate in the lingula. The remainder of the left lung is clear. The right lung is clear. There is no significant pleural effusion. The heart and pulmonary vascularity are normal. There is calcification in the wall of the aortic arch. The trachea is midline. The bony thorax exhibits no acute abnormality. IMPRESSION: Lingular infiltrate most compatible with pneumonia. If the patient's symptoms do not improve with the current antibiotics or with a subsequent regimen, chest CT scanning would be a useful next imaging step to exclude underlying malignancy. Electronically Signed   By: David  Martinique M.D.   On: 01/02/2017 12:43    Microbiology: Recent Results (from the past 240 hour(s))  Culture, blood (routine x 2)     Status: None (Preliminary result)   Collection Time: 01/02/17  4:26 PM  Result Value Ref Range Status   Specimen Description LEFT ANTECUBITAL  Final   Special Requests BOTTLES DRAWN AEROBIC AND ANAEROBIC 6CC  Final   Culture NO GROWTH 2 DAYS  Final   Report Status PENDING  Incomplete  Culture, blood (routine x 2)     Status: None (Preliminary result)   Collection Time: 01/02/17  4:31 PM  Result Value Ref Range Status   Specimen Description BLOOD LEFT WRIST  Final   Special Requests BOTTLES DRAWN AEROBIC AND ANAEROBIC 6CC  Final   Culture NO GROWTH 2 DAYS  Final   Report Status PENDING  Incomplete     Labs: Basic Metabolic Panel:  Recent Labs Lab 01/02/17 1317 01/03/17 0456 01/04/17 0451  NA 134* 134* 133*  K 4.6 4.1 4.2  CL 96* 99* 97*  CO2 30 30 27   GLUCOSE 104* 86 142*  BUN 17 13 18   CREATININE 1.17 0.88 0.95  CALCIUM 8.5* 8.0* 8.4*   Liver Function Tests:  Recent Labs Lab 01/02/17 1317  AST  27  ALT 21  ALKPHOS 45  BILITOT 1.3*  PROT 7.2  ALBUMIN 3.5   No results for input(s): LIPASE, AMYLASE in the last 168 hours. No results for input(s): AMMONIA in the last 168 hours. CBC:  Recent Labs Lab 01/02/17 1317 01/03/17 0456  WBC 17.5* 12.9*  NEUTROABS 12.4*  --   HGB 15.8 14.6  HCT 47.5 43.2  MCV 93.1 92.3  PLT 270 265   Cardiac Enzymes: No results for input(s): CKTOTAL, CKMB, CKMBINDEX, TROPONINI in the last 168 hours. BNP: BNP (last 3 results) No results for input(s): BNP in the last 8760 hours.  ProBNP (last 3 results) No results for input(s): PROBNP in the last 8760 hours.  CBG: No results for input(s): GLUCAP in the last 168 hours.     Signed:  Rexene Alberts MD.  Sheliah Plane  [facility] 01/04/2017, 1:06 PM

## 2017-01-08 LAB — CULTURE, BLOOD (ROUTINE X 2)
CULTURE: NO GROWTH
Culture: NO GROWTH

## 2017-01-18 DIAGNOSIS — J189 Pneumonia, unspecified organism: Secondary | ICD-10-CM | POA: Diagnosis not present

## 2017-02-01 DIAGNOSIS — N401 Enlarged prostate with lower urinary tract symptoms: Secondary | ICD-10-CM | POA: Diagnosis not present

## 2017-02-01 DIAGNOSIS — R972 Elevated prostate specific antigen [PSA]: Secondary | ICD-10-CM | POA: Diagnosis not present

## 2017-02-01 DIAGNOSIS — R3912 Poor urinary stream: Secondary | ICD-10-CM | POA: Diagnosis not present

## 2017-02-07 ENCOUNTER — Ambulatory Visit (HOSPITAL_COMMUNITY)
Admission: RE | Admit: 2017-02-07 | Discharge: 2017-02-07 | Disposition: A | Discharge status: Home / Self Care | Payer: Medicare Other | Source: Ambulatory Visit | Attending: Family Medicine | Admitting: Family Medicine

## 2017-02-07 ENCOUNTER — Other Ambulatory Visit (HOSPITAL_COMMUNITY): Payer: Self-pay | Admitting: Family Medicine

## 2017-02-07 DIAGNOSIS — J189 Pneumonia, unspecified organism: Secondary | ICD-10-CM | POA: Insufficient documentation

## 2017-02-07 DIAGNOSIS — R05 Cough: Secondary | ICD-10-CM | POA: Diagnosis not present

## 2017-02-07 DIAGNOSIS — E663 Overweight: Secondary | ICD-10-CM | POA: Diagnosis not present

## 2017-02-07 DIAGNOSIS — Z6825 Body mass index (BMI) 25.0-25.9, adult: Secondary | ICD-10-CM | POA: Insufficient documentation

## 2017-05-30 DIAGNOSIS — R05 Cough: Secondary | ICD-10-CM | POA: Diagnosis not present

## 2017-05-30 DIAGNOSIS — J302 Other seasonal allergic rhinitis: Secondary | ICD-10-CM | POA: Diagnosis not present

## 2017-06-02 DIAGNOSIS — J069 Acute upper respiratory infection, unspecified: Secondary | ICD-10-CM | POA: Diagnosis not present

## 2017-06-02 DIAGNOSIS — J343 Hypertrophy of nasal turbinates: Secondary | ICD-10-CM | POA: Diagnosis not present

## 2017-06-02 DIAGNOSIS — R0602 Shortness of breath: Secondary | ICD-10-CM | POA: Diagnosis not present

## 2017-06-02 DIAGNOSIS — R062 Wheezing: Secondary | ICD-10-CM | POA: Diagnosis not present

## 2017-08-14 DIAGNOSIS — H5203 Hypermetropia, bilateral: Secondary | ICD-10-CM | POA: Diagnosis not present

## 2017-08-14 DIAGNOSIS — Z961 Presence of intraocular lens: Secondary | ICD-10-CM | POA: Diagnosis not present

## 2017-08-14 DIAGNOSIS — H52223 Regular astigmatism, bilateral: Secondary | ICD-10-CM | POA: Diagnosis not present

## 2017-08-14 DIAGNOSIS — H524 Presbyopia: Secondary | ICD-10-CM | POA: Diagnosis not present

## 2017-09-18 DIAGNOSIS — L218 Other seborrheic dermatitis: Secondary | ICD-10-CM | POA: Diagnosis not present

## 2017-09-18 DIAGNOSIS — Z85828 Personal history of other malignant neoplasm of skin: Secondary | ICD-10-CM | POA: Diagnosis not present

## 2017-09-18 DIAGNOSIS — L814 Other melanin hyperpigmentation: Secondary | ICD-10-CM | POA: Diagnosis not present

## 2017-09-18 DIAGNOSIS — L820 Inflamed seborrheic keratosis: Secondary | ICD-10-CM | POA: Diagnosis not present

## 2017-09-18 DIAGNOSIS — L570 Actinic keratosis: Secondary | ICD-10-CM | POA: Diagnosis not present

## 2017-09-21 DIAGNOSIS — H26493 Other secondary cataract, bilateral: Secondary | ICD-10-CM | POA: Diagnosis not present

## 2017-09-28 DIAGNOSIS — Z23 Encounter for immunization: Secondary | ICD-10-CM | POA: Diagnosis not present

## 2017-10-25 DIAGNOSIS — Z1389 Encounter for screening for other disorder: Secondary | ICD-10-CM | POA: Diagnosis not present

## 2017-10-25 DIAGNOSIS — E782 Mixed hyperlipidemia: Secondary | ICD-10-CM | POA: Diagnosis not present

## 2017-10-25 DIAGNOSIS — I10 Essential (primary) hypertension: Secondary | ICD-10-CM | POA: Diagnosis not present

## 2017-10-25 DIAGNOSIS — R7309 Other abnormal glucose: Secondary | ICD-10-CM | POA: Diagnosis not present

## 2017-10-25 DIAGNOSIS — Z0000 Encounter for general adult medical examination without abnormal findings: Secondary | ICD-10-CM | POA: Diagnosis not present

## 2017-11-30 DIAGNOSIS — Z23 Encounter for immunization: Secondary | ICD-10-CM | POA: Diagnosis not present

## 2018-10-01 DIAGNOSIS — D0462 Carcinoma in situ of skin of left upper limb, including shoulder: Secondary | ICD-10-CM | POA: Diagnosis not present

## 2018-10-01 DIAGNOSIS — L570 Actinic keratosis: Secondary | ICD-10-CM | POA: Diagnosis not present

## 2018-10-01 DIAGNOSIS — D1801 Hemangioma of skin and subcutaneous tissue: Secondary | ICD-10-CM | POA: Diagnosis not present

## 2018-10-01 DIAGNOSIS — Z85828 Personal history of other malignant neoplasm of skin: Secondary | ICD-10-CM | POA: Diagnosis not present

## 2018-10-01 DIAGNOSIS — D045 Carcinoma in situ of skin of trunk: Secondary | ICD-10-CM | POA: Diagnosis not present

## 2018-10-01 DIAGNOSIS — L821 Other seborrheic keratosis: Secondary | ICD-10-CM | POA: Diagnosis not present

## 2018-10-03 DIAGNOSIS — H524 Presbyopia: Secondary | ICD-10-CM | POA: Diagnosis not present

## 2018-10-03 DIAGNOSIS — Z961 Presence of intraocular lens: Secondary | ICD-10-CM | POA: Diagnosis not present

## 2018-10-03 DIAGNOSIS — H52223 Regular astigmatism, bilateral: Secondary | ICD-10-CM | POA: Diagnosis not present

## 2018-10-03 DIAGNOSIS — H5203 Hypermetropia, bilateral: Secondary | ICD-10-CM | POA: Diagnosis not present

## 2018-10-29 DIAGNOSIS — E782 Mixed hyperlipidemia: Secondary | ICD-10-CM | POA: Diagnosis not present

## 2018-10-29 DIAGNOSIS — Z0000 Encounter for general adult medical examination without abnormal findings: Secondary | ICD-10-CM | POA: Diagnosis not present

## 2018-10-29 DIAGNOSIS — Z0001 Encounter for general adult medical examination with abnormal findings: Secondary | ICD-10-CM | POA: Diagnosis not present

## 2018-10-29 DIAGNOSIS — Z1389 Encounter for screening for other disorder: Secondary | ICD-10-CM | POA: Diagnosis not present

## 2018-11-09 DIAGNOSIS — Z23 Encounter for immunization: Secondary | ICD-10-CM | POA: Diagnosis not present

## 2018-11-12 DIAGNOSIS — J019 Acute sinusitis, unspecified: Secondary | ICD-10-CM | POA: Diagnosis not present

## 2018-11-12 DIAGNOSIS — R05 Cough: Secondary | ICD-10-CM | POA: Diagnosis not present

## 2018-11-27 DIAGNOSIS — J22 Unspecified acute lower respiratory infection: Secondary | ICD-10-CM | POA: Diagnosis not present

## 2018-11-27 DIAGNOSIS — J209 Acute bronchitis, unspecified: Secondary | ICD-10-CM | POA: Diagnosis not present

## 2019-06-12 DIAGNOSIS — Z1389 Encounter for screening for other disorder: Secondary | ICD-10-CM | POA: Diagnosis not present

## 2019-06-12 DIAGNOSIS — Z6824 Body mass index (BMI) 24.0-24.9, adult: Secondary | ICD-10-CM | POA: Diagnosis not present

## 2019-06-12 DIAGNOSIS — E7849 Other hyperlipidemia: Secondary | ICD-10-CM | POA: Diagnosis not present

## 2019-06-12 DIAGNOSIS — Z0001 Encounter for general adult medical examination with abnormal findings: Secondary | ICD-10-CM | POA: Diagnosis not present

## 2019-06-12 DIAGNOSIS — E782 Mixed hyperlipidemia: Secondary | ICD-10-CM | POA: Diagnosis not present

## 2019-06-12 DIAGNOSIS — I10 Essential (primary) hypertension: Secondary | ICD-10-CM | POA: Diagnosis not present

## 2019-07-01 DIAGNOSIS — M25512 Pain in left shoulder: Secondary | ICD-10-CM | POA: Diagnosis not present

## 2019-09-09 ENCOUNTER — Other Ambulatory Visit: Payer: Self-pay

## 2019-09-09 NOTE — Patient Outreach (Signed)
[place] [facility]) Care Management  09/09/2019  Russell Bryant 01-22-1941 [id number]   Medication Adherence call to Mr. Russell Bryant [facility] Identifiers Verify spoke with patient he is past due on Simvastatin 20 mg patient explain he is only taking 1/2 tablet daily not 1 tablet daily because he was having side effects (leg Cramps),patient explain he has a doctors appointment coming and will go over with his doctor.left a message a doctors office to give patient a call. [name] is showing past due under [name].   [facility] Direct Dial [phone]  Fax [phone] [email or web address]

## 2019-09-24 DIAGNOSIS — Z23 Encounter for immunization: Secondary | ICD-10-CM | POA: Diagnosis not present

## 2019-11-25 DIAGNOSIS — I10 Essential (primary) hypertension: Secondary | ICD-10-CM | POA: Diagnosis not present

## 2019-11-25 DIAGNOSIS — E7849 Other hyperlipidemia: Secondary | ICD-10-CM | POA: Diagnosis not present

## 2020-01-26 DIAGNOSIS — I10 Essential (primary) hypertension: Secondary | ICD-10-CM | POA: Diagnosis not present

## 2020-01-26 DIAGNOSIS — E7849 Other hyperlipidemia: Secondary | ICD-10-CM | POA: Diagnosis not present

## 2020-03-25 DIAGNOSIS — E7849 Other hyperlipidemia: Secondary | ICD-10-CM | POA: Diagnosis not present

## 2020-03-25 DIAGNOSIS — I10 Essential (primary) hypertension: Secondary | ICD-10-CM | POA: Diagnosis not present

## 2020-06-24 DIAGNOSIS — I10 Essential (primary) hypertension: Secondary | ICD-10-CM | POA: Diagnosis not present

## 2020-06-24 DIAGNOSIS — E7849 Other hyperlipidemia: Secondary | ICD-10-CM | POA: Diagnosis not present

## 2020-07-28 DIAGNOSIS — R739 Hyperglycemia, unspecified: Secondary | ICD-10-CM | POA: Diagnosis not present

## 2020-07-28 DIAGNOSIS — Z0000 Encounter for general adult medical examination without abnormal findings: Secondary | ICD-10-CM | POA: Diagnosis not present

## 2020-07-28 DIAGNOSIS — I10 Essential (primary) hypertension: Secondary | ICD-10-CM | POA: Diagnosis not present

## 2020-07-28 DIAGNOSIS — Z6823 Body mass index (BMI) 23.0-23.9, adult: Secondary | ICD-10-CM | POA: Diagnosis not present

## 2020-07-28 DIAGNOSIS — E782 Mixed hyperlipidemia: Secondary | ICD-10-CM | POA: Diagnosis not present

## 2020-07-28 DIAGNOSIS — Z0001 Encounter for general adult medical examination with abnormal findings: Secondary | ICD-10-CM | POA: Diagnosis not present

## 2020-07-28 DIAGNOSIS — Z1389 Encounter for screening for other disorder: Secondary | ICD-10-CM | POA: Diagnosis not present

## 2020-07-28 DIAGNOSIS — G4700 Insomnia, unspecified: Secondary | ICD-10-CM | POA: Diagnosis not present

## 2020-08-05 DIAGNOSIS — R7309 Other abnormal glucose: Secondary | ICD-10-CM | POA: Diagnosis not present

## 2020-08-25 DIAGNOSIS — E7849 Other hyperlipidemia: Secondary | ICD-10-CM | POA: Diagnosis not present

## 2020-08-25 DIAGNOSIS — I10 Essential (primary) hypertension: Secondary | ICD-10-CM | POA: Diagnosis not present

## 2020-09-14 DIAGNOSIS — Z23 Encounter for immunization: Secondary | ICD-10-CM | POA: Diagnosis not present

## 2021-01-06 DIAGNOSIS — L309 Dermatitis, unspecified: Secondary | ICD-10-CM | POA: Diagnosis not present

## 2021-01-22 DIAGNOSIS — J069 Acute upper respiratory infection, unspecified: Secondary | ICD-10-CM | POA: Diagnosis not present

## 2021-03-17 DIAGNOSIS — Z6823 Body mass index (BMI) 23.0-23.9, adult: Secondary | ICD-10-CM | POA: Diagnosis not present

## 2021-03-17 DIAGNOSIS — Z0001 Encounter for general adult medical examination with abnormal findings: Secondary | ICD-10-CM | POA: Diagnosis not present

## 2021-09-27 DIAGNOSIS — G4700 Insomnia, unspecified: Secondary | ICD-10-CM | POA: Diagnosis not present

## 2021-09-27 DIAGNOSIS — Z23 Encounter for immunization: Secondary | ICD-10-CM | POA: Diagnosis not present

## 2021-11-23 DIAGNOSIS — T1511XA Foreign body in conjunctival sac, right eye, initial encounter: Secondary | ICD-10-CM | POA: Diagnosis not present

## 2022-01-23 ENCOUNTER — Encounter (HOSPITAL_COMMUNITY): Payer: Self-pay

## 2022-01-23 ENCOUNTER — Other Ambulatory Visit: Payer: Self-pay

## 2022-01-23 ENCOUNTER — Emergency Department (HOSPITAL_COMMUNITY)
Admission: EM | Admit: 2022-01-23 | Discharge: 2022-01-23 | Disposition: A | Discharge status: Home / Self Care | Payer: Medicare Other | Attending: Emergency Medicine | Admitting: Emergency Medicine

## 2022-01-23 DIAGNOSIS — H6123 Impacted cerumen, bilateral: Secondary | ICD-10-CM | POA: Diagnosis not present

## 2022-01-23 DIAGNOSIS — Z79899 Other long term (current) drug therapy: Secondary | ICD-10-CM | POA: Diagnosis not present

## 2022-01-23 DIAGNOSIS — Z7982 Long term (current) use of aspirin: Secondary | ICD-10-CM | POA: Diagnosis not present

## 2022-01-23 DIAGNOSIS — H9393 Unspecified disorder of ear, bilateral: Secondary | ICD-10-CM | POA: Diagnosis present

## 2022-01-23 MED ORDER — HYDROGEN PEROXIDE 3 % EX SOLN
CUTANEOUS | Status: AC
  Administered 2022-01-23: 1 via OTIC
  Filled 2022-01-23: qty 473

## 2022-01-23 MED ORDER — DOCUSATE SODIUM 50 MG/5ML PO LIQD
10.0000 mg | Freq: Once | ORAL | Status: AC
  Administered 2022-01-23: 10 mg via OTIC
  Filled 2022-01-23: qty 10

## 2022-01-23 NOTE — ED Triage Notes (Signed)
Reports ears feel like they are clogged up and have wax build up.  States that he cannot hear wife at home.  Resp even and unlabored.  Skin warm and dry.  Denies pain.  nad

## 2022-01-23 NOTE — ED Provider Notes (Signed)
[facility] EMERGENCY DEPARTMENT Provider Note   CSN: [id number] Arrival date & time: 01/23/22  7517     History  Chief Complaint  Patient presents with   Ear Fullness    Russell Bryant is a 81 y.o. male who presents to the ED today with complaint of feeling like his ears are clogged.  He states that he has been having difficulty hearing his wife at home for the past week or so.  He states that his son bought over-the-counter eardrops for wax removal which she tried without relief.  He denies any pain.  He has no other complaints at this time.   The history is provided by the patient and medical records.      Home Medications Prior to Admission medications   Medication Sig Start Date End Date Taking? Authorizing Provider  amLODipine (NORVASC) 10 MG tablet Take 1 tablet (10 mg total) by mouth daily. RESTART AMLODIPINE IN 3 DAYS. Patient taking differently: Take 10 mg by mouth daily.  01/03/15   Rexene Alberts, MD  aspirin EC 81 MG tablet Take 81 mg by mouth daily.      [provider]  benazepril (LOTENSIN) 20 MG tablet Take 20 mg by mouth daily.    [provider]  benzonatate (TESSALON) 100 MG capsule Take 1 capsule (100 mg total) by mouth 3 (three) times daily as needed for cough. 01/04/17   Rexene Alberts, MD  doxycycline (VIBRAMYCIN) 100 MG capsule Take 1 capsule (100 mg total) by mouth 2 (two) times daily. Antibiotic to be taken until completed. 01/04/17   Rexene Alberts, MD  finasteride (PROSCAR) 5 MG tablet Take 5 mg by mouth daily.    [provider]  simvastatin (ZOCOR) 20 MG tablet Take 20 mg by mouth at bedtime. 10/21/16   [provider]      Allergies    Patient has no known allergies.    Review of Systems   Review of Systems  Constitutional:  Negative for chills and fever.  HENT:  Positive for hearing loss. Negative for ear pain.   All other systems reviewed and are negative.  Physical Exam Updated Vital Signs BP (!) 162/81     Pulse 82    Temp 98.1 F (36.7 C)    Resp 16    Ht 5\' 8"  (1.727 m)    Wt 72.6 kg    SpO2 97%    BMI 24.33 kg/m  Physical Exam Vitals and nursing note reviewed.  Constitutional:      Appearance: He is not ill-appearing.  HENT:     Head: Normocephalic and atraumatic.     Right Ear: There is impacted cerumen.     Left Ear: There is impacted cerumen.  Eyes:     Conjunctiva/sclera: Conjunctivae normal.  Cardiovascular:     Rate and Rhythm: Normal rate and regular rhythm.  Pulmonary:     Effort: Pulmonary effort is normal.     Breath sounds: Normal breath sounds.  Skin:    General: Skin is warm and dry.     Coloration: Skin is not jaundiced.  Neurological:     Mental Status: He is alert.    ED Results / Procedures / Treatments   Labs (all labs ordered are listed, but only abnormal results are displayed) Labs Reviewed - No data to display  EKG None  Radiology No results found.  Procedures Procedures    Medications Ordered in ED Medications  docusate (COLACE) 50 MG/5ML liquid 10 mg (  10 mg Both EARS Given 01/23/22 1030)  hydrogen peroxide 3 % external solution (1 application Both EARS Given 01/23/22 1030)    ED Course/ Medical Decision Making/ A&P                           Medical Decision Making 81 year old male who presents to the ED today with complaint of hearing loss/feeling like his ears are clogged.  On arrival to the ED vitals are stable.  Patient appears to be in no acute distress.  He is noted to have bilateral cerumen impaction.  Nursing staff to attempt irrigation with Colace at this time for earwax removal.  She has no other complaints.  Do not feel he requires additional emergent work-up in the ER today.   Ears successfully irrigated by nursing staff. On reevaluation ears are clear. Tms without signs of infection. Pt reports significant improvement in his symptoms. Will discharge home at this time.   Problems Addressed: Bilateral impacted cerumen: acute  illness or injury  Risk OTC drugs.          Final Clinical Impression(s) / ED Diagnoses Final diagnoses:  Bilateral impacted cerumen    Rx / DC Orders ED Discharge Orders     None        Discharge Instructions      Please follow up with your PCP regarding ED visit today  Return to the ED for any new/worsening symptoms       Eustaquio Maize, PA-C 01/23/22 Spencer, Alvin Critchley, DO 01/23/22 1602

## 2022-01-23 NOTE — Discharge Instructions (Signed)
Please follow up with your PCP regarding ED visit today  Return to the ED for any new/worsening symptoms

## 2022-06-15 DIAGNOSIS — I10 Essential (primary) hypertension: Secondary | ICD-10-CM | POA: Diagnosis not present

## 2022-06-15 DIAGNOSIS — Z0001 Encounter for general adult medical examination with abnormal findings: Secondary | ICD-10-CM | POA: Diagnosis not present

## 2022-06-15 DIAGNOSIS — Z6823 Body mass index (BMI) 23.0-23.9, adult: Secondary | ICD-10-CM | POA: Diagnosis not present

## 2022-09-12 DIAGNOSIS — Z23 Encounter for immunization: Secondary | ICD-10-CM | POA: Diagnosis not present

## 2023-01-25 DIAGNOSIS — I10 Essential (primary) hypertension: Secondary | ICD-10-CM | POA: Diagnosis not present

## 2023-01-25 DIAGNOSIS — G4700 Insomnia, unspecified: Secondary | ICD-10-CM | POA: Diagnosis not present

## 2023-05-15 ENCOUNTER — Other Ambulatory Visit: Payer: Self-pay

## 2023-05-15 ENCOUNTER — Encounter (HOSPITAL_COMMUNITY): Payer: Self-pay | Admitting: Emergency Medicine

## 2023-05-15 ENCOUNTER — Emergency Department (HOSPITAL_COMMUNITY)
Admission: EM | Admit: 2023-05-15 | Discharge: 2023-05-15 | Disposition: A | Discharge status: Home / Self Care | Payer: Medicare Other | Attending: Emergency Medicine | Admitting: Emergency Medicine

## 2023-05-15 ENCOUNTER — Emergency Department (HOSPITAL_COMMUNITY): Payer: Medicare Other

## 2023-05-15 DIAGNOSIS — Z7982 Long term (current) use of aspirin: Secondary | ICD-10-CM | POA: Diagnosis not present

## 2023-05-15 DIAGNOSIS — Z79899 Other long term (current) drug therapy: Secondary | ICD-10-CM | POA: Insufficient documentation

## 2023-05-15 DIAGNOSIS — R110 Nausea: Secondary | ICD-10-CM | POA: Insufficient documentation

## 2023-05-15 DIAGNOSIS — R42 Dizziness and giddiness: Secondary | ICD-10-CM | POA: Diagnosis not present

## 2023-05-15 DIAGNOSIS — I10 Essential (primary) hypertension: Secondary | ICD-10-CM | POA: Insufficient documentation

## 2023-05-15 LAB — CBC
HCT: 43.4 % (ref 39.0–52.0)
Hemoglobin: 14.8 g/dL (ref 13.0–17.0)
MCH: 30.8 pg (ref 26.0–34.0)
MCHC: 34.1 g/dL (ref 30.0–36.0)
MCV: 90.4 fL (ref 80.0–100.0)
Platelets: 156 10*3/uL (ref 150–400)
RBC: 4.8 MIL/uL (ref 4.22–5.81)
RDW: 13.2 % (ref 11.5–15.5)
WBC: 7.5 10*3/uL (ref 4.0–10.5)
nRBC: 0 % (ref 0.0–0.2)

## 2023-05-15 LAB — COMPREHENSIVE METABOLIC PANEL
ALT: 18 U/L (ref 0–44)
AST: 27 U/L (ref 15–41)
Albumin: 4.1 g/dL (ref 3.5–5.0)
Alkaline Phosphatase: 53 U/L (ref 38–126)
Anion gap: 9 (ref 5–15)
BUN: 16 mg/dL (ref 8–23)
CO2: 25 mmol/L (ref 22–32)
Calcium: 8.7 mg/dL — ABNORMAL LOW (ref 8.9–10.3)
Chloride: 102 mmol/L (ref 98–111)
Creatinine, Ser: 0.93 mg/dL (ref 0.61–1.24)
GFR, Estimated: >60 mL/min (ref >60)
Glucose, Bld: 112 mg/dL — ABNORMAL HIGH (ref 70–99)
Potassium: 4 mmol/L (ref 3.5–5.1)
Sodium: 136 mmol/L (ref 135–145)
Total Bilirubin: 1.3 mg/dL — ABNORMAL HIGH (ref 0.3–1.2)
Total Protein: 7.3 g/dL (ref 6.5–8.1)

## 2023-05-15 MED ORDER — MECLIZINE HCL 25 MG PO TABS: 25.0000 mg | ORAL_TABLET | Freq: Three times a day (TID) | ORAL | 1 refills | Status: DC | PRN

## 2023-05-15 MED ORDER — SODIUM CHLORIDE 0.9 % IV BOLUS
1000.0000 mL | Freq: Once | INTRAVENOUS | Status: AC
  Administered 2023-05-15: 1000 mL via INTRAVENOUS

## 2023-05-15 MED ORDER — MECLIZINE HCL 12.5 MG PO TABS
25.0000 mg | ORAL_TABLET | Freq: Once | ORAL | Status: AC
  Administered 2023-05-15: 25 mg via ORAL
  Filled 2023-05-15: qty 2

## 2023-05-15 MED ORDER — ONDANSETRON HCL 4 MG/2ML IJ SOLN
4.0000 mg | Freq: Once | INTRAMUSCULAR | Status: AC
  Administered 2023-05-15: 4 mg via INTRAVENOUS
  Filled 2023-05-15: qty 2

## 2023-05-15 NOTE — ED Provider Notes (Signed)
[place] EMERGENCY DEPARTMENT AT [facility] Provider Note   CSN: [id number] Arrival date & time: 05/15/23  8657     History  Chief Complaint  Patient presents with   Dizziness    Russell Bryant is a 82 y.o. male.  Patient is an 82 year old male with past medical history of hypertension, hyperlipidemia.  Patient presenting today with complaints of dizziness.  This started yesterday afternoon at approximately 5 PM.  He describes a sudden onset of a spinning sensation that made him feel nauseated.  This has come and gone throughout the evening.  He stood up to walk to the bathroom, the dizziness worsened.  He denies any weakness or numbness.  He denies any ringing in his ears or hearing loss.  No history of vertigo or stroke.  The history is provided by the patient.       Home Medications Prior to Admission medications   Medication Sig Start Date End Date Taking? Authorizing Provider  amLODipine (NORVASC) 10 MG tablet Take 1 tablet (10 mg total) by mouth daily. RESTART AMLODIPINE IN 3 DAYS. Patient taking differently: Take 10 mg by mouth daily.  01/03/15   Elliot Cousin, MD  aspirin EC 81 MG tablet Take 81 mg by mouth daily.      [provider]  benazepril (LOTENSIN) 20 MG tablet Take 20 mg by mouth daily.    [provider]  benzonatate (TESSALON) 100 MG capsule Take 1 capsule (100 mg total) by mouth 3 (three) times daily as needed for cough. 01/04/17   Elliot Cousin, MD  doxycycline (VIBRAMYCIN) 100 MG capsule Take 1 capsule (100 mg total) by mouth 2 (two) times daily. Antibiotic to be taken until completed. 01/04/17   Elliot Cousin, MD  finasteride (PROSCAR) 5 MG tablet Take 5 mg by mouth daily.    [provider]  simvastatin (ZOCOR) 20 MG tablet Take 20 mg by mouth at bedtime. 10/21/16   [provider]      Allergies    Patient has no known allergies.    Review of Systems   Review of Systems  All other systems reviewed  and are negative.   Physical Exam Updated Vital Signs BP (!) 158/75 (BP Location: Left Arm)   Pulse 65   Temp 97.9 F (36.6 C) (Oral)   Resp 20   Ht 5\' 8"  (1.727 m)   Wt 74.8 kg   SpO2 99%   BMI 25.09 kg/m  Physical Exam Vitals and nursing note reviewed.  Constitutional:      General: He is not in acute distress.    Appearance: He is well-developed. He is not diaphoretic.  HENT:     Head: Normocephalic and atraumatic.  Cardiovascular:     Rate and Rhythm: Normal rate and regular rhythm.     Heart sounds: No murmur heard.    No friction rub.  Pulmonary:     Effort: Pulmonary effort is normal. No respiratory distress.     Breath sounds: Normal breath sounds. No wheezing or rales.  Abdominal:     General: Bowel sounds are normal. There is no distension.     Palpations: Abdomen is soft.     Tenderness: There is no abdominal tenderness.  Musculoskeletal:        General: Normal range of motion.     Cervical back: Normal range of motion and neck supple.  Skin:    General: Skin is warm and dry.  Neurological:     General:  No focal deficit present.     Mental Status: He is alert and oriented to person, place, and time.     Cranial Nerves: No cranial nerve deficit.     Motor: No weakness.     Coordination: Coordination normal.     Comments: Patient is neurologically intact, but symptoms are exacerbated with movement and change of position.     ED Results / Procedures / Treatments   Labs (all labs ordered are listed, but only abnormal results are displayed) Labs Reviewed  CBC  COMPREHENSIVE METABOLIC PANEL    EKG EKG Interpretation  Date/Time:  Monday May 15 2023 02:58:29 EDT Ventricular Rate:  67 PR Interval:  210 QRS Duration: 88 QT Interval:  399 QTC Calculation: 422 R Axis:   52 Text Interpretation: Sinus rhythm Ventricular premature complex Confirmed by Geoffery Lyons ([id number]) on 05/15/2023 3:01:38 AM  Radiology No results found.  Procedures Procedures     Medications Ordered in ED Medications  sodium chloride 0.9 % bolus 1,000 mL (has no administration in time range)  meclizine (ANTIVERT) tablet 25 mg (has no administration in time range)  ondansetron (ZOFRAN) injection 4 mg (has no administration in time range)    ED Course/ Medical Decision Making/ A&P  Patient is an [age]-year-old male presenting with complaints of dizziness as described in the HPI.  Patient arrives here with stable vital signs and is afebrile.  He is neurologically intact, but does have worsening symptoms with movement of his head.  Workup initiated including CBC, CMP, both of which are unremarkable.  Patient sent for a CT scan of the head showing no acute intracranial abnormality, but mild for age cerebral white matter changes.  Patient given 1 L of normal saline along with Zofran for nausea.  He was also given a dose of meclizine which has significantly helped.  He is now able to turn his head and move without dizziness.  Given the nature of his symptoms, this is most likely related to a peripheral vertigo.  Doubt central vertigo.  Patient to be treated with meclizine and as needed return.  Final Clinical Impression(s) / ED Diagnoses Final diagnoses:  None    Rx / DC Orders ED Discharge Orders     None         Geoffery Lyons, MD 05/15/23 [phone]

## 2023-05-15 NOTE — Discharge Instructions (Signed)
Begin taking meclizine as prescribed as needed for dizziness.  Follow-up with primary doctor if not improving in the next few days, and return to the ER if symptoms significantly worsen or change. 

## 2023-05-15 NOTE — ED Notes (Signed)
Patient transported to CT 

## 2023-05-15 NOTE — ED Triage Notes (Signed)
Pt with c/o dizziness since yesterday. States he has nausea with it.

## 2023-05-25 ENCOUNTER — Telehealth: Payer: Self-pay

## 2023-05-25 NOTE — Telephone Encounter (Signed)
Transition Care Management Follow-up Telephone Call Date of discharge and from where: 05/15/2023 [facility] How have you been since you were released from the hospital? Patient is feeling much better Any questions or concerns? No  Items Reviewed: Did the pt receive and understand the discharge instructions provided? Yes  Medications obtained and verified? Yes  Other? No  Any new allergies since your discharge? No  Dietary orders reviewed? Yes Do you have support at home? Yes   Follow up appointments reviewed:  PCP Hospital f/u appt confirmed? Yes  Scheduled to see Assunta Found, MD  on 05/30/2023 @ [facility]. Specialist Hospital f/u appt confirmed? No  Scheduled to see  on  @ . Are transportation arrangements needed? No  If their condition worsens, is the pt aware to call PCP or go to the Emergency Dept.? Yes Was the patient provided with contact information for the PCP's office or ED? Yes Was to pt encouraged to call back with questions or concerns? Yes  Sera Hitsman [facility]  [facility] Population Health Community Resource Care Guide   [email or web address]  ?? [phone]   Website: [email or web address]  [email or web address]

## 2023-05-30 DIAGNOSIS — Z6824 Body mass index (BMI) 24.0-24.9, adult: Secondary | ICD-10-CM | POA: Diagnosis not present

## 2023-05-30 DIAGNOSIS — I10 Essential (primary) hypertension: Secondary | ICD-10-CM | POA: Diagnosis not present

## 2023-05-30 DIAGNOSIS — G4700 Insomnia, unspecified: Secondary | ICD-10-CM | POA: Diagnosis not present

## 2023-05-30 DIAGNOSIS — E782 Mixed hyperlipidemia: Secondary | ICD-10-CM | POA: Diagnosis not present

## 2023-05-30 DIAGNOSIS — Z0001 Encounter for general adult medical examination with abnormal findings: Secondary | ICD-10-CM | POA: Diagnosis not present

## 2023-07-11 DIAGNOSIS — Z6824 Body mass index (BMI) 24.0-24.9, adult: Secondary | ICD-10-CM | POA: Diagnosis not present

## 2023-07-11 DIAGNOSIS — E039 Hypothyroidism, unspecified: Secondary | ICD-10-CM | POA: Diagnosis not present

## 2023-08-10 DIAGNOSIS — J069 Acute upper respiratory infection, unspecified: Secondary | ICD-10-CM | POA: Diagnosis not present

## 2023-08-10 DIAGNOSIS — Z6824 Body mass index (BMI) 24.0-24.9, adult: Secondary | ICD-10-CM | POA: Diagnosis not present

## 2023-08-12 DIAGNOSIS — H6123 Impacted cerumen, bilateral: Secondary | ICD-10-CM | POA: Diagnosis not present

## 2023-08-12 DIAGNOSIS — U071 COVID-19: Secondary | ICD-10-CM | POA: Diagnosis not present

## 2023-08-14 DIAGNOSIS — Z6824 Body mass index (BMI) 24.0-24.9, adult: Secondary | ICD-10-CM | POA: Diagnosis not present

## 2023-08-14 DIAGNOSIS — H6123 Impacted cerumen, bilateral: Secondary | ICD-10-CM | POA: Diagnosis not present

## 2023-08-24 ENCOUNTER — Encounter (HOSPITAL_COMMUNITY): Payer: Self-pay | Admitting: Emergency Medicine

## 2023-08-24 ENCOUNTER — Other Ambulatory Visit: Payer: Self-pay

## 2023-08-24 ENCOUNTER — Emergency Department (HOSPITAL_COMMUNITY)
Admission: EM | Admit: 2023-08-24 | Discharge: 2023-08-24 | Disposition: A | Discharge status: Home / Self Care | Payer: Medicare Other | Attending: Emergency Medicine | Admitting: Emergency Medicine

## 2023-08-24 ENCOUNTER — Emergency Department (HOSPITAL_COMMUNITY): Payer: Medicare Other

## 2023-08-24 DIAGNOSIS — K869 Disease of pancreas, unspecified: Secondary | ICD-10-CM | POA: Diagnosis not present

## 2023-08-24 DIAGNOSIS — N201 Calculus of ureter: Secondary | ICD-10-CM | POA: Insufficient documentation

## 2023-08-24 DIAGNOSIS — I10 Essential (primary) hypertension: Secondary | ICD-10-CM | POA: Diagnosis not present

## 2023-08-24 DIAGNOSIS — U071 COVID-19: Secondary | ICD-10-CM | POA: Diagnosis not present

## 2023-08-24 DIAGNOSIS — K5730 Diverticulosis of large intestine without perforation or abscess without bleeding: Secondary | ICD-10-CM | POA: Diagnosis not present

## 2023-08-24 DIAGNOSIS — K8020 Calculus of gallbladder without cholecystitis without obstruction: Secondary | ICD-10-CM | POA: Diagnosis not present

## 2023-08-24 DIAGNOSIS — R109 Unspecified abdominal pain: Secondary | ICD-10-CM

## 2023-08-24 DIAGNOSIS — K8689 Other specified diseases of pancreas: Secondary | ICD-10-CM | POA: Insufficient documentation

## 2023-08-24 LAB — COMPREHENSIVE METABOLIC PANEL
ALT: 27 U/L (ref 0–44)
AST: 27 U/L (ref 15–41)
Albumin: 4.1 g/dL (ref 3.5–5.0)
Alkaline Phosphatase: 58 U/L (ref 38–126)
Anion gap: 11 (ref 5–15)
BUN: 16 mg/dL (ref 8–23)
CO2: 26 mmol/L (ref 22–32)
Calcium: 9 mg/dL (ref 8.9–10.3)
Chloride: 99 mmol/L (ref 98–111)
Creatinine, Ser: 1.24 mg/dL (ref 0.61–1.24)
GFR, Estimated: 57 mL/min — ABNORMAL LOW (ref >60)
Glucose, Bld: 133 mg/dL — ABNORMAL HIGH (ref 70–99)
Potassium: 4.4 mmol/L (ref 3.5–5.1)
Sodium: 136 mmol/L (ref 135–145)
Total Bilirubin: 1.3 mg/dL — ABNORMAL HIGH (ref 0.3–1.2)
Total Protein: 7.9 g/dL (ref 6.5–8.1)

## 2023-08-24 LAB — URINALYSIS, ROUTINE W REFLEX MICROSCOPIC
Bilirubin Urine: NEGATIVE
Glucose, UA: NEGATIVE mg/dL
Hgb urine dipstick: NEGATIVE
Ketones, ur: 5 mg/dL — AB
Leukocytes,Ua: NEGATIVE
Nitrite: NEGATIVE
Protein, ur: 100 mg/dL — AB
Specific Gravity, Urine: 1.023 (ref 1.005–1.030)
pH: 5 (ref 5.0–8.0)

## 2023-08-24 LAB — CBC
HCT: 44 % (ref 39.0–52.0)
Hemoglobin: 14.7 g/dL (ref 13.0–17.0)
MCH: 30 pg (ref 26.0–34.0)
MCHC: 33.4 g/dL (ref 30.0–36.0)
MCV: 89.8 fL (ref 80.0–100.0)
Platelets: 342 10*3/uL (ref 150–400)
RBC: 4.9 MIL/uL (ref 4.22–5.81)
RDW: 12.6 % (ref 11.5–15.5)
WBC: 9.5 10*3/uL (ref 4.0–10.5)
nRBC: 0 % (ref 0.0–0.2)

## 2023-08-24 LAB — SARS CORONAVIRUS 2 BY RT PCR: SARS Coronavirus 2 by RT PCR: POSITIVE — AB

## 2023-08-24 LAB — LIPASE, BLOOD: Lipase: 41 U/L (ref 11–51)

## 2023-08-24 MED ORDER — ONDANSETRON HCL 4 MG PO TABS: 4.0000 mg | ORAL_TABLET | Freq: Three times a day (TID) | ORAL | 0 refills | Status: DC | PRN

## 2023-08-24 NOTE — ED Triage Notes (Signed)
Pt presents with left-sided abdominal pain that started today with nausea, tested positive with Covid last week, per son currently negative.

## 2023-08-24 NOTE — Discharge Instructions (Signed)
Okay to take extra strength Tylenol.  Take the Zofran as needed for any nausea or vomiting.  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 Jena Gauss.  Also there was evidence of a 9 mm stone in the distal left ureter that needs follow-up with urology.  Hopefully will continue to pass this.  Return for any new or worse symptoms.

## 2023-08-24 NOTE — ED Notes (Signed)
Pt transported to CT ?

## 2023-08-24 NOTE — ED Provider Notes (Addendum)
[place] EMERGENCY DEPARTMENT AT [facility] Provider Note   CSN: [id number] Arrival date & time: 08/24/23  1447     History  Chief Complaint  Patient presents with   Abdominal Pain    Russell Bryant is a 82 y.o. male.  Patient tested positive for COVID last week was on Paxil bid.  Improved significantly.  Today at about 1300 sudden onset of left-sided flank abdominal pain associated with dry heaves and nausea.  No diarrhea.  No fevers.  That pain has now gone away after voiding here.  No prior history of kidney stones.  Temperature 98 blood pressure 148/78 oxygen sats 94%.  Patient completely asymptomatic currently.  Past medical history significant for pancreatitis hypertension he says history of hiatal hernia high cholesterol arthritis and diverticulosis.  Patient is never used tobacco products.       Home Medications Prior to Admission medications   Medication Sig Start Date End Date Taking? Authorizing Provider  ondansetron (ZOFRAN) 4 MG tablet Take 1 tablet (4 mg total) by mouth every 8 (eight) hours as needed for nausea or vomiting. 08/24/23  Yes Vanetta Mulders, MD  amLODipine (NORVASC) 10 MG tablet Take 1 tablet (10 mg total) by mouth daily. RESTART AMLODIPINE IN 3 DAYS. Patient taking differently: Take 10 mg by mouth daily.  01/03/15   Elliot Cousin, MD  aspirin EC 81 MG tablet Take 81 mg by mouth daily.      [provider]  benazepril (LOTENSIN) 20 MG tablet Take 20 mg by mouth daily.    [provider]  benzonatate (TESSALON) 100 MG capsule Take 1 capsule (100 mg total) by mouth 3 (three) times daily as needed for cough. 01/04/17   Elliot Cousin, MD  doxycycline (VIBRAMYCIN) 100 MG capsule Take 1 capsule (100 mg total) by mouth 2 (two) times daily. Antibiotic to be taken until completed. 01/04/17   Elliot Cousin, MD  finasteride (PROSCAR) 5 MG tablet Take 5 mg by mouth daily.    [provider]  meclizine (ANTIVERT) 25 MG tablet  Take 1 tablet (25 mg total) by mouth 3 (three) times daily as needed for dizziness. 05/15/23   Geoffery Lyons, MD  simvastatin (ZOCOR) 20 MG tablet Take 20 mg by mouth at bedtime. 10/21/16   [provider]      Allergies    Patient has no known allergies.    Review of Systems   Review of Systems  Constitutional:  Negative for chills and fever.  HENT:  Negative for ear pain and sore throat.   Eyes:  Negative for pain and visual disturbance.  Respiratory:  Negative for cough and shortness of breath.   Cardiovascular:  Negative for chest pain and palpitations.  Gastrointestinal:  Positive for abdominal pain, nausea and vomiting.  Genitourinary:  Positive for flank pain. Negative for dysuria and hematuria.  Musculoskeletal:  Negative for arthralgias and back pain.  Skin:  Negative for color change and rash.  Neurological:  Negative for seizures and syncope.  All other systems reviewed and are negative.   Physical Exam Updated Vital Signs BP 135/78   Pulse 79   Temp 98 F (36.7 C) (Oral)   Resp 19   Ht 1.727 m (5\' 8" )   Wt 72.6 kg   SpO2 94%   BMI 24.33 kg/m  Physical Exam Vitals and nursing note reviewed.  Constitutional:      General: He is not in acute distress.    Appearance: Normal appearance. He is  well-developed.  HENT:     Head: Normocephalic and atraumatic.     Mouth/Throat:     Mouth: Mucous membranes are moist.  Eyes:     Extraocular Movements: Extraocular movements intact.     Conjunctiva/sclera: Conjunctivae normal.     Pupils: Pupils are equal, round, and reactive to light.  Cardiovascular:     Rate and Rhythm: Normal rate and regular rhythm.     Heart sounds: No murmur heard. Pulmonary:     Effort: Pulmonary effort is normal. No respiratory distress.     Breath sounds: Normal breath sounds.  Abdominal:     General: There is no distension.     Palpations: Abdomen is soft.     Tenderness: There is no abdominal tenderness. There is no guarding.   Musculoskeletal:        General: No swelling.     Cervical back: Normal range of motion and neck supple.     Right lower leg: No edema.     Left lower leg: No edema.  Skin:    General: Skin is warm and dry.     Capillary Refill: Capillary refill takes less than 2 seconds.  Neurological:     General: No focal deficit present.     Mental Status: He is alert and oriented to person, place, and time.     Cranial Nerves: No cranial nerve deficit.     Sensory: No sensory deficit.  Psychiatric:        Mood and Affect: Mood normal.     ED Results / Procedures / Treatments   Labs (all labs ordered are listed, but only abnormal results are displayed) Labs Reviewed  SARS CORONAVIRUS 2 BY RT PCR - Abnormal; Notable for the following components:      Result Value   SARS Coronavirus 2 by RT PCR POSITIVE (*)    All other components within normal limits  COMPREHENSIVE METABOLIC PANEL - Abnormal; Notable for the following components:   Glucose, Bld 133 (*)    Total Bilirubin 1.3 (*)    GFR, Estimated 57 (*)    All other components within normal limits  URINALYSIS, ROUTINE W REFLEX MICROSCOPIC - Abnormal; Notable for the following components:   Ketones, ur 5 (*)    Protein, ur 100 (*)    Bacteria, UA RARE (*)    All other components within normal limits  LIPASE, BLOOD  CBC    EKG EKG Interpretation Date/Time:  Thursday August 24 2023 15:22:31 EDT Ventricular Rate:  75 PR Interval:  180 QRS Duration:  84 QT Interval:  398 QTC Calculation: 444 R Axis:   49  Text Interpretation: Normal sinus rhythm Normal ECG When compared with ECG of 15-May-2023 02:58, PREVIOUS ECG IS PRESENT Confirmed by Cathren Laine ([id number]) on 08/24/2023 3:26:58 PM  Radiology CT Renal Stone Study  Result Date: 08/24/2023 CLINICAL DATA:  Left-sided abdominal pain, nausea EXAM: CT ABDOMEN AND PELVIS WITHOUT CONTRAST TECHNIQUE: Multidetector CT imaging of the abdomen and pelvis was performed following the standard  protocol without IV contrast. RADIATION DOSE REDUCTION: This exam was performed according to the departmental dose-optimization program which includes automated exposure control, adjustment of the mA and/or kV according to patient size and/or use of iterative reconstruction technique. COMPARISON:  12/31/2014 FINDINGS: Lower chest: Progressive bibasilar scarring and subpleural fibrosis. No acute pleural or parenchymal lung disease. Hepatobiliary: Multiple calcified gallstones layer dependently within the gallbladder. No evidence of acute cholecystitis. Unremarkable unenhanced appearance of the liver. No biliary duct dilation  or choledocholithiasis. Pancreas: Interval development of a 4.5 x 3.2 cm mass at the head of the pancreas, reference image 32/2. There is atrophy of the pancreatic body and tail. No acute inflammatory changes. Spleen: Unremarkable unenhanced appearance. Adrenals/Urinary Tract: There is a 9 mm nonobstructing distal left ureteral calculus, reference image 74/2. No right-sided calculi. The adrenals and bladder are unremarkable. Stomach/Bowel: No bowel obstruction or ileus. Normal appendix right lower quadrant. Diverticulosis of the descending and sigmoid colon without evidence of acute diverticulitis. No bowel wall thickening or inflammatory change. Small hiatal hernia. Vascular/Lymphatic: Aortic atherosclerosis. No enlarged abdominal or pelvic lymph nodes. Reproductive: Prostate is enlarged measuring 5.8 x 4.7 cm. Other: No free fluid or free intraperitoneal gas. Fat containing left inguinal hernia. No bowel herniation. Musculoskeletal: No acute or destructive bony abnormalities. Reconstructed images demonstrate no additional findings. IMPRESSION: 1. Nonobstructing 9 mm distal left ureteral calculus. 2. 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 When the patient is clinically stable and able to follow directions and hold their breath (preferably as an  outpatient) further evaluation with dedicated pancreatic MRI should be considered if the patient would be a therapy candidate should neoplasm be detected. 3. Cholelithiasis without cholecystitis. 4. Distal colonic diverticulosis without diverticulitis. 5.  Aortic Atherosclerosis (ICD10-I70.0). 6. Enlarged prostate. Electronically Signed   By: Sharlet Salina M.D.   On: 08/24/2023 21:49    Procedures Procedures    Medications Ordered in ED Medications - No data to display  ED Course/ Medical Decision Making/ A&P                                 Medical Decision Making Amount and/or Complexity of Data Reviewed Labs: ordered. Radiology: ordered.  Risk Prescription drug management.   Urinalysis 6-10 red blood cells rare bacteria not consistent with urinary tract infection.  Lipase normal at 41 complete metabolic panel normal other than total bili at 1.3 GFR 57 blood sugar 133 not acidotic anion gap normal.  CBC no leukocytosis hemoglobin good at 14.7 platelets 342 patient's COVID test is positive but he is not currently symptomatic.  No hypoxia.  Symptoms could very well have been secondary to a left-sided kidney stone ureteral kidney stone so we will go ahead and get CT renal just to make sure that he has passed it.  CT scan showed a left nonobstructing 9 mm distal left ureteral stone.  This coincides with his history.  He will need close follow-up with urology to make sure he passes this.  Currently asymptomatic.  Will have him take extra strength Tylenol and Zofran as needed.  There was an incidental finding of a 4.5 cm mass in the pancreatic head with distal pancreatic parenchymal atrophy.  Patient will need additional workup for this.  Also there was evidence of cholelithiasis but no evidence of any cholecystitis.  Patient's abdomen is soft and nontender.  Not worried about any prolonged biliary colic.  So patient will follow-up with urology and gastroenterology used to be followed by Dr.  Jena Gauss in the past.  Both patient and family member are aware of the finding of the pancreatic mass.   Final Clinical Impression(s) / ED Diagnoses Final diagnoses:  Flank pain, acute  Left ureteral stone  Pancreatic mass    Rx / DC Orders ED Discharge Orders          Ordered    ondansetron (ZOFRAN) 4 MG tablet  Every 8 hours PRN        08/24/23 2336              Vanetta Mulders, MD 08/24/23 2055    Vanetta Mulders, MD 08/24/23 [phone]

## 2023-08-25 MED FILL — Ondansetron HCl Tab 4 MG: ORAL | Qty: 4 | Status: AC

## 2023-09-07 DIAGNOSIS — Z23 Encounter for immunization: Secondary | ICD-10-CM | POA: Diagnosis not present

## 2023-09-25 ENCOUNTER — Encounter: Payer: Self-pay | Admitting: Urology

## 2023-09-25 ENCOUNTER — Ambulatory Visit (HOSPITAL_COMMUNITY): Admission: RE | Admit: 2023-09-25 | Payer: Medicare Other | Source: Ambulatory Visit

## 2023-09-25 ENCOUNTER — Ambulatory Visit (INDEPENDENT_AMBULATORY_CARE_PROVIDER_SITE_OTHER): Payer: Medicare Other | Admitting: Urology

## 2023-09-25 VITALS — BP 154/78 | HR 65 | Temp 97.9°F | Ht 68.0 in | Wt 150.0 lb

## 2023-09-25 DIAGNOSIS — N401 Enlarged prostate with lower urinary tract symptoms: Secondary | ICD-10-CM | POA: Diagnosis not present

## 2023-09-25 DIAGNOSIS — N400 Enlarged prostate without lower urinary tract symptoms: Secondary | ICD-10-CM

## 2023-09-25 DIAGNOSIS — N200 Calculus of kidney: Secondary | ICD-10-CM | POA: Insufficient documentation

## 2023-09-25 DIAGNOSIS — R109 Unspecified abdominal pain: Secondary | ICD-10-CM | POA: Diagnosis not present

## 2023-09-25 DIAGNOSIS — N201 Calculus of ureter: Secondary | ICD-10-CM | POA: Diagnosis not present

## 2023-09-25 LAB — URINALYSIS, ROUTINE W REFLEX MICROSCOPIC
Bilirubin, UA: NEGATIVE
Glucose, UA: NEGATIVE
Ketones, UA: NEGATIVE
Leukocytes,UA: NEGATIVE
Nitrite, UA: NEGATIVE
Protein,UA: NEGATIVE
Specific Gravity, UA: 1.03 (ref 1.005–1.030)
Urobilinogen, Ur: 0.2 mg/dL (ref 0.2–1.0)
pH, UA: 6 (ref 5.0–7.5)

## 2023-09-25 LAB — BLADDER SCAN AMB NON-IMAGING: Scan Result: 0

## 2023-09-25 LAB — MICROSCOPIC EXAMINATION
Bacteria, UA: NONE SEEN
WBC, UA: NONE SEEN /[HPF] (ref 0–5)

## 2023-09-25 MED ORDER — TAMSULOSIN HCL 0.4 MG PO CAPS
0.4000 mg | ORAL_CAPSULE | Freq: Every day | ORAL | 0 refills | Status: DC
Start: 2023-09-25 — End: 2023-10-19

## 2023-09-25 NOTE — Progress Notes (Signed)
post void residual = 0 ml

## 2023-09-26 ENCOUNTER — Ambulatory Visit (HOSPITAL_COMMUNITY)
Admission: RE | Admit: 2023-09-26 | Discharge: 2023-09-26 | Disposition: A | Discharge status: Home / Self Care | Payer: Medicare Other | Source: Ambulatory Visit | Attending: Urology | Admitting: Urology

## 2023-09-26 DIAGNOSIS — N200 Calculus of kidney: Secondary | ICD-10-CM | POA: Insufficient documentation

## 2023-09-26 DIAGNOSIS — N201 Calculus of ureter: Secondary | ICD-10-CM | POA: Insufficient documentation

## 2023-09-26 DIAGNOSIS — R109 Unspecified abdominal pain: Secondary | ICD-10-CM | POA: Diagnosis not present

## 2023-09-27 ENCOUNTER — Telehealth: Payer: Self-pay

## 2023-09-27 NOTE — Telephone Encounter (Signed)
-----   Message from Donnita Falls sent at 09/26/2023  6:07 PM EDT ----- Please let pt know the left ureteral stone is still visible on KUB. No hydronephrosis per RUS. Can continue with Flomax daily x2 weeks for medical expulsive therapy and f/u with KUB on 10/11/2023 as scheduled or I can consult with MD about stone procedure now if he prefers. Please let me know what he would like to do. Thanks.

## 2023-09-27 NOTE — Telephone Encounter (Signed)
Patient informed of results.  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  He will keep 10/16 appt and discuss options at that time.  Patient is aware to have KUB done prior.

## 2023-10-03 NOTE — Progress Notes (Signed)
Name: Russell Bryant DOB: 1941/04/06 MRN: [id number]  History of Present Illness: Russell Bryant is a 82 y.o. male who presents today for follow up visit at [facility] [place]. - GU History: 1. BPH with LUTS (weak stream). - Previously took Proscar 5 mg daily. - Prostate size = 5.8 x 4.7 cm per CT on 08/24/2023. 2. Elevated PSA.  - No prior PSA results found per chart review today. - Patient states he had a negative prostate biopsy by Russell Bryant.   Recent history:  > 08/24/2023:  - Seen in ER left abdominal pain. - CT showed non-obstructing 9 mm distal left ureteral stone.  - No prior history of kidney stones.  > 09/25/2023: Seen in urology clinic. He was asymptomatic at that time. The plan was repeat imaging and Flomax for MET.  > 09/26/2023: - Left ureteral stone still visible on KUB. RUS showed no hydronephrosis. - "Patient informed of results. 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  He will keep 10/16 appt and discuss options at that time.  Patient is aware to have KUB done prior."  Today: KUB today: Awaiting radiology read; 9 mm left ureteral stone appears unchanged.  He denies recent stone passage. He denies flank pain or abdominal pain. He denies fevers, nausea, or vomiting.  He denies increased urinary urgency, frequency, nocturia, dysuria, gross hematuria, hesitancy, straining to void, or sensations of incomplete emptying.   Fall Screening: Do you usually have a device to assist in your mobility? No   Medications: Current Outpatient Medications  Medication Sig Dispense Refill   ALPRAZolam (XANAX) 1 MG tablet Take 1 mg by mouth 2 (two) times daily.     amLODipine (NORVASC) 10 MG tablet Take 1 tablet (10 mg total) by mouth daily. RESTART AMLODIPINE IN 3 DAYS. (Patient taking differently: Take 10 mg by mouth daily.)     aspirin EC 81 MG tablet Take 81 mg by mouth daily.       benazepril  (LOTENSIN) 20 MG tablet Take 20 mg by mouth daily.     levothyroxine (SYNTHROID) 50 MCG tablet Take 50 mcg by mouth daily.     tamsulosin (FLOMAX) 0.4 MG CAPS capsule Take 1 capsule (0.4 mg total) by mouth daily. 30 capsule 0   No current facility-administered medications for this visit.    Allergies: No Known Allergies  Past Medical History:  Diagnosis Date   Arthritis    Diverticulosis 2014   pancolonic per colonoscopy   H/O hiatal hernia    Hernia    Hypercholesteremia    Hypertension    Pancreatitis    Past Surgical History:  Procedure Laterality Date   CATARACT EXTRACTION W/PHACO  11/05/2012   Procedure: CATARACT EXTRACTION PHACO AND INTRAOCULAR LENS PLACEMENT (IOC);  Surgeon: Gemma Payor, MD;  Location: [facility];  Service: Ophthalmology;  Laterality: Left;  CDE 18.11   CATARACT EXTRACTION W/PHACO  11/19/2012   Procedure: CATARACT EXTRACTION PHACO AND INTRAOCULAR LENS PLACEMENT (IOC);  Surgeon: Gemma Payor, MD;  Location: [facility];  Service: Ophthalmology;  Laterality: Right;  CDE:  19.10   COLONOSCOPY N/A 09/04/2013   [id number] diverticulosis   MASS EXCISION Left 08/28/2014   Procedure: 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;  Surgeon: Cindee Salt, MD;  Location: [facility];  Service: Orthopedics;  Laterality: Left;   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   ROTATOR  CUFF REPAIR     bilateral-MCH   Family History  Problem Relation Age of Onset   Heart attack Father    Social History   Socioeconomic History   Marital status: Married    Spouse name: Not on file   Number of children: Not on file   Years of education: Not on file   Highest education level: Not on file  Occupational History   Not on file  Tobacco Use   Smoking status: Never   Smokeless tobacco: Never  Substance and Sexual Activity   Alcohol use: No   Drug use: No   Sexual activity: Yes    Birth control/protection: None  Other Topics Concern   Not  on file  Social History Narrative   Not on file   Social Determinants of Health   Financial Resource Strain: Not on file  Food Insecurity: Not on file  Transportation Needs: Not on file  Physical Activity: Not on file  Stress: Not on file  Social Connections: Not on file  Intimate Partner Violence: Not on file    SUBJECTIVE  Review of Systems Constitutional: Patient denies any unintentional weight loss or change in strength lntegumentary: Patient denies any rashes or pruritus Cardiovascular: Patient denies chest pain or syncope Respiratory: Patient denies shortness of breath Gastrointestinal: Patient denies nausea, vomiting, constipation, or diarrhea Musculoskeletal: Patient denies muscle cramps or weakness Neurologic: Patient denies convulsions or seizures Allergic/Immunologic: Patient denies recent allergic reaction(s) Hematologic/Lymphatic: Patient denies bleeding tendencies Endocrine: Patient denies heat/cold intolerance  GU: As per HPI.  OBJECTIVE Vitals:   10/11/23 1310  BP: (!) 146/77  Pulse: 75  Temp: 98 F (36.7 C)   Body mass index is 22.81 kg/m.  Physical Examination Constitutional: No obvious distress; patient is non-toxic appearing  Cardiovascular: No visible lower extremity edema.  Respiratory: The patient does not have audible wheezing/stridor; respirations do not appear labored  Gastrointestinal: Abdomen non-distended Musculoskeletal: Normal ROM of UEs  Skin: No obvious rashes/open sores  Neurologic: CN 2-12 grossly intact Psychiatric: Answered questions appropriately with normal affect  Hematologic/Lymphatic/Immunologic: No obvious bruises or sites of spontaneous bleeding  UA: negative   ASSESSMENT Left ureteral stone - Plan: Urinalysis, Routine w reflex microscopic, Ambulatory Referral For Surgery Scheduling  Kidney stones - Plan: Ambulatory Referral For Surgery Scheduling  Benign prostatic hyperplasia, unspecified whether lower urinary  tract symptoms present  We reviewed recent imaging results; 9 mm distal left ureteral stone has not progressed. He remains asymptomatic. We discussed risks associated with persistent observation such as hydronephrosis, UTI, etc. Advised extracorporeal shock wave lithotripsy (ESWL). We discussed possible risks and benefits of this procedure including but not limited to: including pain, hematuria, UTI, need for a second procedure. Pt verbalized understanding and agreement; he requested surgery date of Tuesday 11/07/2023 because his daughter is available to assist him / take care of his wife at that time. Dr. Ronne Binning was consulted in office today and agreed with plan.   Patient was advised to contact urology provider or go to the ER if He develops fever >101F, uncontrollable pain, or other significantly concerning symptoms.  He verbalized understanding and agreement. All questions were answered.   PLAN Advised the following: Surgery request submitted for left ESWL with Dr. Ronne Binning.  Orders Placed This Encounter  Procedures   Urinalysis, Routine w reflex microscopic   Ambulatory Referral For Surgery Scheduling    Referral Priority:   Routine    Referral Type:   Consultation    Number of Visits Requested:  1    It has been explained that the patient is to follow regularly with their PCP in addition to all other providers involved in their care and to follow instructions provided by these respective offices. Patient advised to contact urology clinic if any urologic-pertaining questions, concerns, new symptoms or problems arise in the interim period.  There are no Patient Instructions on file for this visit.  Electronically signed by:  Donnita Falls, MSN, FNP-C, CUNP 10/11/2023 1:44 PM

## 2023-10-03 NOTE — H&P (View-Only) (Signed)
Name: Russell Bryant DOB: 1941/04/06 MRN: [id number]  History of Present Illness: Russell Bryant is a 82 y.o. male who presents today for follow up visit at [facility]. - GU History: 1. BPH with LUTS (weak stream). - Previously took Proscar 5 mg daily. - Prostate size = 5.8 x 4.7 cm per CT on 08/24/2023. 2. Elevated PSA.  - No prior PSA results found per chart review today. - Patient states he had a negative prostate biopsy by Dr. Patsi Sears.   Recent history:  > 08/24/2023:  - Seen in ER left abdominal pain. - CT showed non-obstructing 9 mm distal left ureteral stone.  - No prior history of kidney stones.  > 09/25/2023: Seen in urology clinic. He was asymptomatic at that time. The plan was repeat imaging and Flomax for MET.  > 09/26/2023: - Left ureteral stone still visible on KUB. RUS showed no hydronephrosis. - "Patient informed of results. 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  He will keep 10/16 appt and discuss options at that time.  Patient is aware to have KUB done prior."  Today: KUB today: Awaiting radiology read; 9 mm left ureteral stone appears unchanged.  He denies recent stone passage. He denies flank pain or abdominal pain. He denies fevers, nausea, or vomiting.  He denies increased urinary urgency, frequency, nocturia, dysuria, gross hematuria, hesitancy, straining to void, or sensations of incomplete emptying.   Fall Screening: Do you usually have a device to assist in your mobility? No   Medications: Current Outpatient Medications  Medication Sig Dispense Refill   ALPRAZolam (XANAX) 1 MG tablet Take 1 mg by mouth 2 (two) times daily.     amLODipine (NORVASC) 10 MG tablet Take 1 tablet (10 mg total) by mouth daily. RESTART AMLODIPINE IN 3 DAYS. (Patient taking differently: Take 10 mg by mouth daily.)     aspirin EC 81 MG tablet Take 81 mg by mouth daily.       benazepril  (LOTENSIN) 20 MG tablet Take 20 mg by mouth daily.     levothyroxine (SYNTHROID) 50 MCG tablet Take 50 mcg by mouth daily.     tamsulosin (FLOMAX) 0.4 MG CAPS capsule Take 1 capsule (0.4 mg total) by mouth daily. 30 capsule 0   No current facility-administered medications for this visit.    Allergies: No Known Allergies  Past Medical History:  Diagnosis Date   Arthritis    Diverticulosis 2014   pancolonic per colonoscopy   H/O hiatal hernia    Hernia    Hypercholesteremia    Hypertension    Pancreatitis    Past Surgical History:  Procedure Laterality Date   CATARACT EXTRACTION W/PHACO  11/05/2012   Procedure: CATARACT EXTRACTION PHACO AND INTRAOCULAR LENS PLACEMENT (IOC);  Surgeon: Gemma Payor, MD;  Location: [facility];  Service: Ophthalmology;  Laterality: Left;  CDE 18.11   CATARACT EXTRACTION W/PHACO  11/19/2012   Procedure: CATARACT EXTRACTION PHACO AND INTRAOCULAR LENS PLACEMENT (IOC);  Surgeon: Gemma Payor, MD;  Location: [facility];  Service: Ophthalmology;  Laterality: Right;  CDE:  19.10   COLONOSCOPY N/A 09/04/2013   WUJ:[id number] diverticulosis   MASS EXCISION Left 08/28/2014   Procedure: 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;  Surgeon: Cindee Salt, MD;  Location: [facility];  Service: Orthopedics;  Laterality: Left;   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   ROTATOR  CUFF REPAIR     bilateral-MCH   Family History  Problem Relation Age of Onset   Heart attack Father    Social History   Socioeconomic History   Marital status: Married    Spouse name: Not on file   Number of children: Not on file   Years of education: Not on file   Highest education level: Not on file  Occupational History   Not on file  Tobacco Use   Smoking status: Never   Smokeless tobacco: Never  Substance and Sexual Activity   Alcohol use: No   Drug use: No   Sexual activity: Yes    Birth control/protection: None  Other Topics Concern   Not  on file  Social History Narrative   Not on file   Social Determinants of Health   Financial Resource Strain: Not on file  Food Insecurity: Not on file  Transportation Needs: Not on file  Physical Activity: Not on file  Stress: Not on file  Social Connections: Not on file  Intimate Partner Violence: Not on file    SUBJECTIVE  Review of Systems Constitutional: Patient denies any unintentional weight loss or change in strength lntegumentary: Patient denies any rashes or pruritus Cardiovascular: Patient denies chest pain or syncope Respiratory: Patient denies shortness of breath Gastrointestinal: Patient denies nausea, vomiting, constipation, or diarrhea Musculoskeletal: Patient denies muscle cramps or weakness Neurologic: Patient denies convulsions or seizures Allergic/Immunologic: Patient denies recent allergic reaction(s) Hematologic/Lymphatic: Patient denies bleeding tendencies Endocrine: Patient denies heat/cold intolerance  GU: As per HPI.  OBJECTIVE Vitals:   10/11/23 1310  BP: (!) 146/77  Pulse: 75  Temp: 98 F (36.7 C)   Body mass index is 22.81 kg/m.  Physical Examination Constitutional: No obvious distress; patient is non-toxic appearing  Cardiovascular: No visible lower extremity edema.  Respiratory: The patient does not have audible wheezing/stridor; respirations do not appear labored  Gastrointestinal: Abdomen non-distended Musculoskeletal: Normal ROM of UEs  Skin: No obvious rashes/open sores  Neurologic: CN 2-12 grossly intact Psychiatric: Answered questions appropriately with normal affect  Hematologic/Lymphatic/Immunologic: No obvious bruises or sites of spontaneous bleeding  UA: negative   ASSESSMENT Left ureteral stone - Plan: Urinalysis, Routine w reflex microscopic, Ambulatory Referral For Surgery Scheduling  Kidney stones - Plan: Ambulatory Referral For Surgery Scheduling  Benign prostatic hyperplasia, unspecified whether lower urinary  tract symptoms present  We reviewed recent imaging results; 9 mm distal left ureteral stone has not progressed. He remains asymptomatic. We discussed risks associated with persistent observation such as hydronephrosis, UTI, etc. Advised extracorporeal shock wave lithotripsy (ESWL). We discussed possible risks and benefits of this procedure including but not limited to: including pain, hematuria, UTI, need for a second procedure. Pt verbalized understanding and agreement; he requested surgery date of Tuesday 11/07/2023 because his daughter is available to assist him / take care of his wife at that time. Dr. Ronne Binning was consulted in office today and agreed with plan.   Patient was advised to contact urology provider or go to the ER if He develops fever >101F, uncontrollable pain, or other significantly concerning symptoms.  He verbalized understanding and agreement. All questions were answered.   PLAN Advised the following: Surgery request submitted for left ESWL with Dr. Ronne Binning.  Orders Placed This Encounter  Procedures   Urinalysis, Routine w reflex microscopic   Ambulatory Referral For Surgery Scheduling    Referral Priority:   Routine    Referral Type:   Consultation    Number of Visits Requested:  1    It has been explained that the patient is to follow regularly with their PCP in addition to all other providers involved in their care and to follow instructions provided by these respective offices. Patient advised to contact urology clinic if any urologic-pertaining questions, concerns, new symptoms or problems arise in the interim period.  There are no Patient Instructions on file for this visit.  Electronically signed by:  Donnita Falls, MSN, FNP-C, CUNP 10/11/2023 1:44 PM

## 2023-10-09 ENCOUNTER — Ambulatory Visit (HOSPITAL_COMMUNITY)
Admission: RE | Admit: 2023-10-09 | Discharge: 2023-10-09 | Disposition: A | Discharge status: Home / Self Care | Payer: Medicare Other | Source: Ambulatory Visit | Attending: Urology | Admitting: Urology

## 2023-10-09 DIAGNOSIS — N201 Calculus of ureter: Secondary | ICD-10-CM | POA: Diagnosis not present

## 2023-10-09 DIAGNOSIS — N200 Calculus of kidney: Secondary | ICD-10-CM | POA: Diagnosis not present

## 2023-10-11 ENCOUNTER — Ambulatory Visit: Payer: Medicare Other | Admitting: Urology

## 2023-10-11 ENCOUNTER — Encounter: Payer: Self-pay | Admitting: Urology

## 2023-10-11 VITALS — BP 146/77 | HR 75 | Temp 98.0°F | Ht 68.0 in | Wt 150.0 lb

## 2023-10-11 DIAGNOSIS — N200 Calculus of kidney: Secondary | ICD-10-CM

## 2023-10-11 DIAGNOSIS — N400 Enlarged prostate without lower urinary tract symptoms: Secondary | ICD-10-CM | POA: Diagnosis not present

## 2023-10-11 DIAGNOSIS — N201 Calculus of ureter: Secondary | ICD-10-CM

## 2023-10-11 LAB — MICROSCOPIC EXAMINATION
Bacteria, UA: NONE SEEN
WBC, UA: NONE SEEN /[HPF] (ref 0–5)

## 2023-10-11 LAB — URINALYSIS, ROUTINE W REFLEX MICROSCOPIC
Bilirubin, UA: NEGATIVE
Glucose, UA: NEGATIVE
Ketones, UA: NEGATIVE
Leukocytes,UA: NEGATIVE
Nitrite, UA: NEGATIVE
Protein,UA: NEGATIVE
Specific Gravity, UA: 1.015 (ref 1.005–1.030)
Urobilinogen, Ur: 0.2 mg/dL (ref 0.2–1.0)
pH, UA: 6 (ref 5.0–7.5)

## 2023-10-12 ENCOUNTER — Other Ambulatory Visit: Payer: Self-pay

## 2023-10-12 DIAGNOSIS — N200 Calculus of kidney: Secondary | ICD-10-CM

## 2023-10-19 ENCOUNTER — Other Ambulatory Visit: Payer: Self-pay | Admitting: Urology

## 2023-10-19 DIAGNOSIS — N400 Enlarged prostate without lower urinary tract symptoms: Secondary | ICD-10-CM

## 2023-10-19 DIAGNOSIS — N200 Calculus of kidney: Secondary | ICD-10-CM

## 2023-10-19 DIAGNOSIS — N201 Calculus of ureter: Secondary | ICD-10-CM

## 2023-11-03 ENCOUNTER — Encounter (HOSPITAL_COMMUNITY)
Admission: RE | Admit: 2023-11-03 | Discharge: 2023-11-03 | Disposition: A | Discharge status: Home / Self Care | Payer: Medicare Other | Source: Ambulatory Visit | Attending: Urology

## 2023-11-03 ENCOUNTER — Other Ambulatory Visit: Payer: Self-pay

## 2023-11-03 ENCOUNTER — Encounter (HOSPITAL_COMMUNITY): Payer: Self-pay

## 2023-11-06 ENCOUNTER — Telehealth: Payer: Self-pay

## 2023-11-06 NOTE — Telephone Encounter (Signed)
Patient asking if he will need to have the fleet enema?  He doesn't want to have that done if he is taking the laxative.  Please advise as soon as possible.   Call:  [phone]

## 2023-11-07 ENCOUNTER — Encounter (HOSPITAL_COMMUNITY): Payer: Self-pay | Admitting: Urology

## 2023-11-07 ENCOUNTER — Encounter (HOSPITAL_COMMUNITY)
Admission: RE | Disposition: A | Discharge status: Home / Self Care | Payer: Self-pay | Source: Home / Self Care | Attending: Urology

## 2023-11-07 ENCOUNTER — Ambulatory Visit (HOSPITAL_COMMUNITY): Payer: Medicare Other

## 2023-11-07 ENCOUNTER — Ambulatory Visit (HOSPITAL_COMMUNITY)
Admission: RE | Admit: 2023-11-07 | Discharge: 2023-11-07 | Disposition: A | Discharge status: Home / Self Care | Payer: Medicare Other | Attending: Urology | Admitting: Urology

## 2023-11-07 DIAGNOSIS — N200 Calculus of kidney: Secondary | ICD-10-CM

## 2023-11-07 DIAGNOSIS — N401 Enlarged prostate with lower urinary tract symptoms: Secondary | ICD-10-CM | POA: Insufficient documentation

## 2023-11-07 DIAGNOSIS — R3912 Poor urinary stream: Secondary | ICD-10-CM | POA: Insufficient documentation

## 2023-11-07 DIAGNOSIS — N201 Calculus of ureter: Secondary | ICD-10-CM | POA: Diagnosis not present

## 2023-11-07 HISTORY — PX: EXTRACORPOREAL SHOCK WAVE LITHOTRIPSY: SHX1557

## 2023-11-07 SURGERY — EXTRACORPOREAL SHOCK WAVE LITHOTRIPSY (ESWL)
Anesthesia: LOCAL | Laterality: Left

## 2023-11-07 MED ORDER — DIPHENHYDRAMINE HCL 25 MG PO CAPS
25.0000 mg | ORAL_CAPSULE | ORAL | Status: AC
  Administered 2023-11-07: 25 mg via ORAL
  Filled 2023-11-07: qty 1

## 2023-11-07 MED ORDER — ONDANSETRON HCL 4 MG PO TABS: 4.0000 mg | ORAL_TABLET | Freq: Every day | ORAL | 1 refills | Status: DC | PRN

## 2023-11-07 MED ORDER — HYDROCODONE-ACETAMINOPHEN 5-325 MG PO TABS: 1.0000 | ORAL_TABLET | Freq: Four times a day (QID) | ORAL | 0 refills | Status: DC | PRN

## 2023-11-07 MED ORDER — DIAZEPAM 5 MG PO TABS
10.0000 mg | ORAL_TABLET | Freq: Once | ORAL | Status: AC
  Administered 2023-11-07: 10 mg via ORAL
  Filled 2023-11-07: qty 2

## 2023-11-07 NOTE — Telephone Encounter (Signed)
Pt had litho on 11/12 before I was able to respond to patient message.

## 2023-11-07 NOTE — Interval H&P Note (Signed)
History and Physical Interval Note:  11/07/2023 7:51 AM  Russell Bryant  has presented today for surgery, with the diagnosis of Left Ureteral Stone.  The various methods of treatment have been discussed with the patient and family. After consideration of risks, benefits and other options for treatment, the patient has consented to  Procedure(s): EXTRACORPOREAL SHOCK WAVE LITHOTRIPSY (ESWL) (Left) as a surgical intervention.  The patient's history has been reviewed, patient examined, no change in status, stable for surgery.  I have reviewed the patient's chart and labs.  Questions were answered to the patient's satisfaction.     Wilkie Aye

## 2023-11-09 DIAGNOSIS — J069 Acute upper respiratory infection, unspecified: Secondary | ICD-10-CM | POA: Diagnosis not present

## 2023-11-09 DIAGNOSIS — R6889 Other general symptoms and signs: Secondary | ICD-10-CM | POA: Diagnosis not present

## 2023-11-09 DIAGNOSIS — Z6823 Body mass index (BMI) 23.0-23.9, adult: Secondary | ICD-10-CM | POA: Diagnosis not present

## 2023-11-10 ENCOUNTER — Encounter (HOSPITAL_COMMUNITY): Payer: Self-pay | Admitting: Urology

## 2023-11-16 NOTE — Progress Notes (Signed)
Name: Russell Bryant DOB: [birth year] MRN: [id number]  Diagnoses: 1) Post-operative state - GU History: 1. BPH with LUTS (weak stream). - Previously took Proscar 5 mg daily. - Prostate size = 5.8 x 4.7 cm per CT on 08/24/2023. 2. Elevated PSA.  - No prior PSA results found per chart review today. - Patient states he had a negative prostate biopsy by Dr. Patsi Sears.  Today:  He presents post-operatively s/p left ESWL procedure on 11/07/2023 by Dr. Ronne Binning for management of a left distal ureteral stone.  Postop course: KUB today: Awaiting radiology read; left distal ureteral stone still present per my interpretation.  He brought some tiny stone fragments with him today; denies passing much sediment since the procedure. He denies flank pain or abdominal pain. He denies fevers, nausea, or vomiting.  He denies increased urinary urgency, frequency, nocturia, dysuria, gross hematuria, hesitancy, straining to void, or sensations of incomplete emptying.   Fall Screening: Do you usually have a device to assist in your mobility? No   Medications: Current Outpatient Medications  Medication Sig Dispense Refill   ALPRAZolam (XANAX) 1 MG tablet Take 1 mg by mouth 2 (two) times daily.     amLODipine (NORVASC) 10 MG tablet Take 1 tablet (10 mg total) by mouth daily. RESTART AMLODIPINE IN 3 DAYS. (Patient taking differently: Take 10 mg by mouth daily.)     aspirin EC 81 MG tablet Take 81 mg by mouth daily.       benazepril (LOTENSIN) 20 MG tablet Take 20 mg by mouth daily.     HYDROcodone-acetaminophen (NORCO) 5-325 MG tablet Take 1 tablet by mouth every 6 (six) hours as needed for moderate pain (pain score 4-6). 30 tablet 0   levothyroxine (SYNTHROID) 50 MCG tablet Take 50 mcg by mouth daily.     ondansetron (ZOFRAN) 4 MG tablet Take 1 tablet (4 mg total) by mouth daily as needed for nausea or vomiting. 30 tablet 1   tamsulosin (FLOMAX) 0.4 MG CAPS capsule Take 1 capsule (0.4 mg total) by  mouth daily. 30 capsule 2   No current facility-administered medications for this visit.    Allergies: No Known Allergies  Past Medical History:  Diagnosis Date   Arthritis    Diverticulosis 2014   pancolonic per colonoscopy   H/O hiatal hernia    Hernia    Hypercholesteremia    Hypertension    Pancreatitis    Past Surgical History:  Procedure Laterality Date   CATARACT EXTRACTION W/PHACO  11/05/2012   Procedure: CATARACT EXTRACTION PHACO AND INTRAOCULAR LENS PLACEMENT (IOC);  Surgeon: Gemma Payor, MD;  Location: [facility];  Service: Ophthalmology;  Laterality: Left;  CDE 18.11   CATARACT EXTRACTION W/PHACO  11/19/2012   Procedure: CATARACT EXTRACTION PHACO AND INTRAOCULAR LENS PLACEMENT (IOC);  Surgeon: Gemma Payor, MD;  Location: [facility];  Service: Ophthalmology;  Laterality: Right;  CDE:  19.10   COLONOSCOPY N/A 09/04/2013   WUJ:[id number] diverticulosis   EXTRACORPOREAL SHOCK WAVE LITHOTRIPSY Left 11/07/2023   Procedure: EXTRACORPOREAL SHOCK WAVE LITHOTRIPSY (ESWL);  Surgeon: Malen Gauze, MD;  Location: [facility];  Service: Urology;  Laterality: Left;   MASS EXCISION Left 08/28/2014   Procedure: 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;  Surgeon: Cindee Salt, MD;  Location: [facility];  Service: Orthopedics;  Laterality: Left;   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   ROTATOR CUFF REPAIR     bilateral-MCH   Family History  Problem Relation Age of  Onset   Heart attack Father    Social History   Socioeconomic History   Marital status: Married    Spouse name: Not on file   Number of children: Not on file   Years of education: Not on file   Highest education level: Not on file  Occupational History   Not on file  Tobacco Use   Smoking status: Never   Smokeless tobacco: Never  Substance and Sexual Activity   Alcohol use: No   Drug use: No   Sexual activity: Yes    Birth control/protection: None  Other Topics Concern    Not on file  Social History Narrative   Not on file   Social Determinants of Health   Financial Resource Strain: Not on file  Food Insecurity: Not on file  Transportation Needs: Not on file  Physical Activity: Not on file  Stress: Not on file  Social Connections: Not on file  Intimate Partner Violence: Not on file    SUBJECTIVE  Review of Systems Constitutional: Patient denies any unintentional weight loss or change in strength lntegumentary: Patient denies any rashes or pruritus Cardiovascular: Patient denies chest pain or syncope Respiratory: Patient denies shortness of breath Gastrointestinal: Patient denies nausea, vomiting, constipation, or diarrhea Musculoskeletal: Patient denies muscle cramps or weakness Neurologic: Patient denies convulsions or seizures Allergic/Immunologic: Patient denies recent allergic reaction(s) Hematologic/Lymphatic: Patient denies bleeding tendencies Endocrine: Patient denies heat/cold intolerance  GU: As per HPI.  OBJECTIVE Vitals:   11/21/23 1443  BP: (!) 155/71  Pulse: 70  Temp: (!) 97.5 F (36.4 C)   There is no height or weight on file to calculate BMI.  Physical Examination Constitutional: No obvious distress; patient is non-toxic appearing  Cardiovascular: No visible lower extremity edema.  Respiratory: The patient does not have audible wheezing/stridor; respirations do not appear labored  Gastrointestinal: Abdomen non-distended Musculoskeletal: Normal ROM of UEs  Skin: No obvious rashes/open sores  Neurologic: CN 2-12 grossly intact Psychiatric: Answered questions appropriately with normal affect  Hematologic/Lymphatic/Immunologic: No obvious bruises or sites of spontaneous bleeding  UA: negative urine microscopy PVR: 80 ml  ASSESSMENT Kidney stones - Plan: Urinalysis, Routine w reflex microscopic, BLADDER SCAN AMB NON-IMAGING, tamsulosin (FLOMAX) 0.4 MG CAPS capsule, Calculi, with Photograph  Left ureteral stone -  Plan: tamsulosin (FLOMAX) 0.4 MG CAPS capsule, Calculi, with Photograph  Benign prostatic hyperplasia, unspecified whether lower urinary tract symptoms present - Plan: tamsulosin (FLOMAX) 0.4 MG CAPS capsule  Postop check - Plan: Calculi, with Photograph  We reviewed the operative procedures and findings. We discussed that yesterday's KUB result is pending formal radiology read but that his left distal ureteral stone is still present per my interpretation. Will consult Dr. Ronne Binning to determine next steps; patient was advised that may include medical expulsive therapy, repeat ESWL, or ureteroscopic stone manipulation. Will notify patient of Dr. Dimas Millin recommendation. Pt verbalized understanding and agreement. All questions were answered.  PLAN Advised the following: Consult note sent to Dr. Ronne Binning. Stone fragments sent for analysis. Return for follow up to be determined per MD recommendation.   Orders Placed This Encounter  Procedures   Urinalysis, Routine w reflex microscopic   Calculi, with Photograph   BLADDER SCAN AMB NON-IMAGING    It has been explained that the patient is to follow regularly with their PCP in addition to all other providers involved in their care and to follow instructions provided by these respective offices. Patient advised to contact urology clinic if any urologic-pertaining questions, concerns, new symptoms  or problems arise in the interim period.  There are no Patient Instructions on file for this visit.  Electronically signed by:  Donnita Falls, MSN, FNP-C, CUNP 11/21/2023 3:05 PM

## 2023-11-16 NOTE — H&P (View-Only) (Signed)
 Name: Russell Bryant DOB: [birth year] MRN: [id number]  Diagnoses: 1) Post-operative state - GU History: 1. BPH with LUTS (weak stream). - Previously took Proscar 5 mg daily. - Prostate size = 5.8 x 4.7 cm per CT on 08/24/2023. 2. Elevated PSA.  - No prior PSA results found per chart review today. - Patient states he had a negative prostate biopsy by Dr. Patsi Sears.  Today:  He presents post-operatively s/p left ESWL procedure on 11/07/2023 by Dr. Ronne Binning for management of a left distal ureteral stone.  Postop course: KUB today: Awaiting radiology read; left distal ureteral stone still present per my interpretation.  He brought some tiny stone fragments with him today; denies passing much sediment since the procedure. He denies flank pain or abdominal pain. He denies fevers, nausea, or vomiting.  He denies increased urinary urgency, frequency, nocturia, dysuria, gross hematuria, hesitancy, straining to void, or sensations of incomplete emptying.   Fall Screening: Do you usually have a device to assist in your mobility? No   Medications: Current Outpatient Medications  Medication Sig Dispense Refill   ALPRAZolam (XANAX) 1 MG tablet Take 1 mg by mouth 2 (two) times daily.     amLODipine (NORVASC) 10 MG tablet Take 1 tablet (10 mg total) by mouth daily. RESTART AMLODIPINE IN 3 DAYS. (Patient taking differently: Take 10 mg by mouth daily.)     aspirin EC 81 MG tablet Take 81 mg by mouth daily.       benazepril (LOTENSIN) 20 MG tablet Take 20 mg by mouth daily.     HYDROcodone-acetaminophen (NORCO) 5-325 MG tablet Take 1 tablet by mouth every 6 (six) hours as needed for moderate pain (pain score 4-6). 30 tablet 0   levothyroxine (SYNTHROID) 50 MCG tablet Take 50 mcg by mouth daily.     ondansetron (ZOFRAN) 4 MG tablet Take 1 tablet (4 mg total) by mouth daily as needed for nausea or vomiting. 30 tablet 1   tamsulosin (FLOMAX) 0.4 MG CAPS capsule Take 1 capsule (0.4 mg total) by  mouth daily. 30 capsule 2   No current facility-administered medications for this visit.    Allergies: No Known Allergies  Past Medical History:  Diagnosis Date   Arthritis    Diverticulosis 2014   pancolonic per colonoscopy   H/O hiatal hernia    Hernia    Hypercholesteremia    Hypertension    Pancreatitis    Past Surgical History:  Procedure Laterality Date   CATARACT EXTRACTION W/PHACO  11/05/2012   Procedure: CATARACT EXTRACTION PHACO AND INTRAOCULAR LENS PLACEMENT (IOC);  Surgeon: Gemma Payor, MD;  Location: [facility];  Service: Ophthalmology;  Laterality: Left;  CDE 18.11   CATARACT EXTRACTION W/PHACO  11/19/2012   Procedure: CATARACT EXTRACTION PHACO AND INTRAOCULAR LENS PLACEMENT (IOC);  Surgeon: Gemma Payor, MD;  Location: [facility];  Service: Ophthalmology;  Laterality: Right;  CDE:  19.10   COLONOSCOPY N/A 09/04/2013   WUJ:[id number] diverticulosis   EXTRACORPOREAL SHOCK WAVE LITHOTRIPSY Left 11/07/2023   Procedure: EXTRACORPOREAL SHOCK WAVE LITHOTRIPSY (ESWL);  Surgeon: Malen Gauze, MD;  Location: [facility];  Service: Urology;  Laterality: Left;   MASS EXCISION Left 08/28/2014   Procedure: 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;  Surgeon: Cindee Salt, MD;  Location: [facility];  Service: Orthopedics;  Laterality: Left;   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   ROTATOR CUFF REPAIR     bilateral-MCH   Family History  Problem Relation Age of  Onset   Heart attack Father    Social History   Socioeconomic History   Marital status: Married    Spouse name: Not on file   Number of children: Not on file   Years of education: Not on file   Highest education level: Not on file  Occupational History   Not on file  Tobacco Use   Smoking status: Never   Smokeless tobacco: Never  Substance and Sexual Activity   Alcohol use: No   Drug use: No   Sexual activity: Yes    Birth control/protection: None  Other Topics Concern    Not on file  Social History Narrative   Not on file   Social Determinants of Health   Financial Resource Strain: Not on file  Food Insecurity: Not on file  Transportation Needs: Not on file  Physical Activity: Not on file  Stress: Not on file  Social Connections: Not on file  Intimate Partner Violence: Not on file    SUBJECTIVE  Review of Systems Constitutional: Patient denies any unintentional weight loss or change in strength lntegumentary: Patient denies any rashes or pruritus Cardiovascular: Patient denies chest pain or syncope Respiratory: Patient denies shortness of breath Gastrointestinal: Patient denies nausea, vomiting, constipation, or diarrhea Musculoskeletal: Patient denies muscle cramps or weakness Neurologic: Patient denies convulsions or seizures Allergic/Immunologic: Patient denies recent allergic reaction(s) Hematologic/Lymphatic: Patient denies bleeding tendencies Endocrine: Patient denies heat/cold intolerance  GU: As per HPI.  OBJECTIVE Vitals:   11/21/23 1443  BP: (!) 155/71  Pulse: 70  Temp: (!) 97.5 F (36.4 C)   There is no height or weight on file to calculate BMI.  Physical Examination Constitutional: No obvious distress; patient is non-toxic appearing  Cardiovascular: No visible lower extremity edema.  Respiratory: The patient does not have audible wheezing/stridor; respirations do not appear labored  Gastrointestinal: Abdomen non-distended Musculoskeletal: Normal ROM of UEs  Skin: No obvious rashes/open sores  Neurologic: CN 2-12 grossly intact Psychiatric: Answered questions appropriately with normal affect  Hematologic/Lymphatic/Immunologic: No obvious bruises or sites of spontaneous bleeding  UA: negative urine microscopy PVR: 80 ml  ASSESSMENT Kidney stones - Plan: Urinalysis, Routine w reflex microscopic, BLADDER SCAN AMB NON-IMAGING, tamsulosin (FLOMAX) 0.4 MG CAPS capsule, Calculi, with Photograph  Left ureteral stone -  Plan: tamsulosin (FLOMAX) 0.4 MG CAPS capsule, Calculi, with Photograph  Benign prostatic hyperplasia, unspecified whether lower urinary tract symptoms present - Plan: tamsulosin (FLOMAX) 0.4 MG CAPS capsule  Postop check - Plan: Calculi, with Photograph  We reviewed the operative procedures and findings. We discussed that yesterday's KUB result is pending formal radiology read but that his left distal ureteral stone is still present per my interpretation. Will consult Dr. Ronne Binning to determine next steps; patient was advised that may include medical expulsive therapy, repeat ESWL, or ureteroscopic stone manipulation. Will notify patient of Dr. Dimas Millin recommendation. Pt verbalized understanding and agreement. All questions were answered.  PLAN Advised the following: Consult note sent to Dr. Ronne Binning. Stone fragments sent for analysis. Return for follow up to be determined per MD recommendation.   Orders Placed This Encounter  Procedures   Urinalysis, Routine w reflex microscopic   Calculi, with Photograph   BLADDER SCAN AMB NON-IMAGING    It has been explained that the patient is to follow regularly with their PCP in addition to all other providers involved in their care and to follow instructions provided by these respective offices. Patient advised to contact urology clinic if any urologic-pertaining questions, concerns, new symptoms  or problems arise in the interim period.  There are no Patient Instructions on file for this visit.  Electronically signed by:  Donnita Falls, MSN, FNP-C, CUNP 11/21/2023 3:05 PM

## 2023-11-20 ENCOUNTER — Ambulatory Visit (HOSPITAL_COMMUNITY)
Admission: RE | Admit: 2023-11-20 | Discharge: 2023-11-20 | Disposition: A | Discharge status: Home / Self Care | Payer: Medicare Other | Source: Ambulatory Visit | Attending: Urology | Admitting: Urology

## 2023-11-20 DIAGNOSIS — N200 Calculus of kidney: Secondary | ICD-10-CM | POA: Insufficient documentation

## 2023-11-20 DIAGNOSIS — N201 Calculus of ureter: Secondary | ICD-10-CM | POA: Diagnosis not present

## 2023-11-21 ENCOUNTER — Ambulatory Visit: Payer: Medicare Other | Admitting: Urology

## 2023-11-21 ENCOUNTER — Encounter: Payer: Self-pay | Admitting: Urology

## 2023-11-21 VITALS — BP 155/71 | HR 70 | Temp 97.5°F

## 2023-11-21 DIAGNOSIS — Z87442 Personal history of urinary calculi: Secondary | ICD-10-CM | POA: Diagnosis not present

## 2023-11-21 DIAGNOSIS — Z09 Encounter for follow-up examination after completed treatment for conditions other than malignant neoplasm: Secondary | ICD-10-CM

## 2023-11-21 DIAGNOSIS — N400 Enlarged prostate without lower urinary tract symptoms: Secondary | ICD-10-CM

## 2023-11-21 DIAGNOSIS — N200 Calculus of kidney: Secondary | ICD-10-CM

## 2023-11-21 DIAGNOSIS — N201 Calculus of ureter: Secondary | ICD-10-CM

## 2023-11-21 LAB — URINALYSIS, ROUTINE W REFLEX MICROSCOPIC
Bilirubin, UA: NEGATIVE
Glucose, UA: NEGATIVE
Ketones, UA: NEGATIVE
Leukocytes,UA: NEGATIVE
Nitrite, UA: NEGATIVE
Protein,UA: NEGATIVE
Specific Gravity, UA: 1.015 (ref 1.005–1.030)
Urobilinogen, Ur: 0.2 mg/dL (ref 0.2–1.0)
pH, UA: 6 (ref 5.0–7.5)

## 2023-11-21 LAB — MICROSCOPIC EXAMINATION: Bacteria, UA: NONE SEEN

## 2023-11-21 LAB — BLADDER SCAN AMB NON-IMAGING: Scan Result: 80

## 2023-11-21 MED ORDER — TAMSULOSIN HCL 0.4 MG PO CAPS: 0.4000 mg | ORAL_CAPSULE | Freq: Every day | ORAL | 2 refills | Status: DC

## 2023-11-21 NOTE — Progress Notes (Signed)
post void residual=80

## 2023-11-22 ENCOUNTER — Other Ambulatory Visit: Payer: Self-pay

## 2023-11-22 ENCOUNTER — Telehealth: Payer: Self-pay

## 2023-11-22 ENCOUNTER — Other Ambulatory Visit: Payer: Self-pay | Admitting: Urology

## 2023-11-22 DIAGNOSIS — N200 Calculus of kidney: Secondary | ICD-10-CM

## 2023-11-22 DIAGNOSIS — N201 Calculus of ureter: Secondary | ICD-10-CM

## 2023-11-22 NOTE — Telephone Encounter (Signed)
Patient is aware of NP's response and that surgery scheduler will reach out next week to confirm procedure date.  Patient voiced understanding.

## 2023-11-22 NOTE — Telephone Encounter (Signed)
-----   Message from Donnita Falls sent at 11/22/2023  8:58 AM EST ----- Please let patient know that Dr. Ronne Binning was consulted and advises repeat ESWL to address the remaining stone material. Surgery request submitted. Patient was already counseled about this at his appointment yesterday and verbalized agreement to proceed as directed by Dr. Ronne Binning.

## 2023-12-01 ENCOUNTER — Encounter (HOSPITAL_COMMUNITY)
Admission: RE | Admit: 2023-12-01 | Discharge: 2023-12-01 | Disposition: A | Discharge status: Home / Self Care | Payer: Medicare Other | Source: Ambulatory Visit | Attending: Urology | Admitting: Urology

## 2023-12-05 ENCOUNTER — Encounter (HOSPITAL_COMMUNITY)
Admission: RE | Disposition: A | Discharge status: Home / Self Care | Payer: Self-pay | Source: Home / Self Care | Attending: Urology

## 2023-12-05 ENCOUNTER — Ambulatory Visit (HOSPITAL_COMMUNITY): Payer: Medicare Other

## 2023-12-05 ENCOUNTER — Ambulatory Visit (HOSPITAL_COMMUNITY)
Admission: RE | Admit: 2023-12-05 | Discharge: 2023-12-05 | Disposition: A | Discharge status: Home / Self Care | Payer: Medicare Other | Attending: Urology | Admitting: Urology

## 2023-12-05 ENCOUNTER — Encounter (HOSPITAL_COMMUNITY): Payer: Self-pay | Admitting: Urology

## 2023-12-05 DIAGNOSIS — N401 Enlarged prostate with lower urinary tract symptoms: Secondary | ICD-10-CM | POA: Diagnosis not present

## 2023-12-05 DIAGNOSIS — I10 Essential (primary) hypertension: Secondary | ICD-10-CM | POA: Diagnosis not present

## 2023-12-05 DIAGNOSIS — N201 Calculus of ureter: Secondary | ICD-10-CM | POA: Insufficient documentation

## 2023-12-05 DIAGNOSIS — N200 Calculus of kidney: Secondary | ICD-10-CM

## 2023-12-05 HISTORY — PX: EXTRACORPOREAL SHOCK WAVE LITHOTRIPSY: SHX1557

## 2023-12-05 SURGERY — EXTRACORPOREAL SHOCK WAVE LITHOTRIPSY (ESWL)
Anesthesia: LOCAL | Laterality: Left

## 2023-12-05 MED ORDER — HYDROCODONE-ACETAMINOPHEN 5-325 MG PO TABS: 1.0000 | ORAL_TABLET | Freq: Four times a day (QID) | ORAL | 0 refills | Status: DC | PRN

## 2023-12-05 MED ORDER — DIAZEPAM 5 MG PO TABS
10.0000 mg | ORAL_TABLET | Freq: Once | ORAL | Status: AC
  Administered 2023-12-05: 10 mg via ORAL
  Filled 2023-12-05: qty 2

## 2023-12-05 MED ORDER — ONDANSETRON HCL 4 MG PO TABS: 4.0000 mg | ORAL_TABLET | Freq: Every day | ORAL | 1 refills | Status: DC | PRN

## 2023-12-05 MED ORDER — DIPHENHYDRAMINE HCL 25 MG PO CAPS
25.0000 mg | ORAL_CAPSULE | ORAL | Status: AC
  Administered 2023-12-05: 25 mg via ORAL
  Filled 2023-12-05: qty 1

## 2023-12-05 NOTE — Interval H&P Note (Signed)
History and Physical Interval Note:  12/05/2023 9:01 AM  Russell Bryant  has presented today for surgery, with the diagnosis of Left Ureteral Stone.  The various methods of treatment have been discussed with the patient and family. After consideration of risks, benefits and other options for treatment, the patient has consented to  Procedure(s): EXTRACORPOREAL SHOCK WAVE LITHOTRIPSY (ESWL) (Left) as a surgical intervention.  The patient's history has been reviewed, patient examined, no change in status, stable for surgery.  I have reviewed the patient's chart and labs.  Questions were answered to the patient's satisfaction.     Wilkie Aye

## 2023-12-06 ENCOUNTER — Encounter (HOSPITAL_COMMUNITY): Payer: Self-pay | Admitting: Urology

## 2023-12-08 DIAGNOSIS — E039 Hypothyroidism, unspecified: Secondary | ICD-10-CM | POA: Diagnosis not present

## 2023-12-08 DIAGNOSIS — N200 Calculus of kidney: Secondary | ICD-10-CM | POA: Diagnosis not present

## 2023-12-08 DIAGNOSIS — Z6823 Body mass index (BMI) 23.0-23.9, adult: Secondary | ICD-10-CM | POA: Diagnosis not present

## 2023-12-08 DIAGNOSIS — J45909 Unspecified asthma, uncomplicated: Secondary | ICD-10-CM | POA: Diagnosis not present

## 2023-12-11 ENCOUNTER — Ambulatory Visit (INDEPENDENT_AMBULATORY_CARE_PROVIDER_SITE_OTHER): Payer: Medicare Other

## 2023-12-11 ENCOUNTER — Ambulatory Visit (HOSPITAL_COMMUNITY)
Admission: RE | Admit: 2023-12-11 | Discharge: 2023-12-11 | Disposition: A | Discharge status: Home / Self Care | Payer: Medicare Other | Source: Ambulatory Visit | Attending: Urology | Admitting: Urology

## 2023-12-11 DIAGNOSIS — N201 Calculus of ureter: Secondary | ICD-10-CM | POA: Insufficient documentation

## 2023-12-11 DIAGNOSIS — R109 Unspecified abdominal pain: Secondary | ICD-10-CM

## 2023-12-11 DIAGNOSIS — N200 Calculus of kidney: Secondary | ICD-10-CM

## 2023-12-11 NOTE — Progress Notes (Deleted)
Name: Russell Bryant DOB: [birth year] MRN: [id number]  Diagnoses: 1) Post-operative state  GU History: 1. BPH with LUTS (weak stream). - Previously took Proscar 5 mg daily. - Prostate size = 5.8 x 4.7 cm per CT on 08/24/2023. 2. Elevated PSA.  - No prior PSA results found per chart review today. - Patient states he had a negative prostate biopsy by Dr. Patsi Sears.  Recent history: > 11/07/2023: Underwent left ESWL by Dr. Ronne Binning for management of a left distal ureteral stone.   > 11/21/2023: Asymptomatic however left ureteral stone still present on KUB. PVR = 80 ml.  > 12/05/2023: Underwent repeat left ESWL procedure on by Dr. Ronne Binning for management of remaining left ureteral stone fragments.  Postop course: KUB ***today: Awaiting radiology read; ***  Today He reports ***  He {Actions; denies-reports:120008} flank pain or abdominal pain. He {Actions; denies-reports:120008} fevers, nausea, or vomiting.  He {Actions; denies-reports:120008} increased urinary urgency, frequency, nocturia, dysuria, gross hematuria, hesitancy, straining to void, or sensations of incomplete emptying.   Fall Screening: Do you usually have a device to assist in your mobility? {yes/no:20286} ***cane / ***walker / ***wheelchair   Medications: Current Outpatient Medications  Medication Sig Dispense Refill   ALPRAZolam (XANAX) 1 MG tablet Take 1 mg by mouth 2 (two) times daily.     amLODipine (NORVASC) 10 MG tablet Take 1 tablet (10 mg total) by mouth daily. RESTART AMLODIPINE IN 3 DAYS. (Patient taking differently: Take 10 mg by mouth daily.)     aspirin EC 81 MG tablet Take 81 mg by mouth daily.       benazepril (LOTENSIN) 20 MG tablet Take 20 mg by mouth daily.     HYDROcodone-acetaminophen (NORCO) 5-325 MG tablet Take 1 tablet by mouth every 6 (six) hours as needed for moderate pain (pain score 4-6). 30 tablet 0   levothyroxine (SYNTHROID) 50 MCG tablet Take 50 mcg by mouth daily.      ondansetron (ZOFRAN) 4 MG tablet Take 1 tablet (4 mg total) by mouth daily as needed for nausea or vomiting. 30 tablet 1   tamsulosin (FLOMAX) 0.4 MG CAPS capsule Take 1 capsule (0.4 mg total) by mouth daily. 30 capsule 2   No current facility-administered medications for this visit.    Allergies: No Known Allergies  Past Medical History:  Diagnosis Date   Arthritis    Diverticulosis 2014   pancolonic per colonoscopy   H/O hiatal hernia    Hernia    Hypercholesteremia    Hypertension    Pancreatitis    Past Surgical History:  Procedure Laterality Date   CATARACT EXTRACTION W/PHACO  11/05/2012   Procedure: CATARACT EXTRACTION PHACO AND INTRAOCULAR LENS PLACEMENT (IOC);  Surgeon: Gemma Payor, MD;  Location: [facility];  Service: Ophthalmology;  Laterality: Left;  CDE 18.11   CATARACT EXTRACTION W/PHACO  11/19/2012   Procedure: CATARACT EXTRACTION PHACO AND INTRAOCULAR LENS PLACEMENT (IOC);  Surgeon: Gemma Payor, MD;  Location: [facility];  Service: Ophthalmology;  Laterality: Right;  CDE:  19.10   COLONOSCOPY N/A 09/04/2013   JOA:CZYSAYT diverticulosis   EXTRACORPOREAL SHOCK WAVE LITHOTRIPSY Left 11/07/2023   Procedure: EXTRACORPOREAL SHOCK WAVE LITHOTRIPSY (ESWL);  Surgeon: Malen Gauze, MD;  Location: [facility];  Service: Urology;  Laterality: Left;   EXTRACORPOREAL SHOCK WAVE LITHOTRIPSY Left 12/05/2023   Procedure: EXTRACORPOREAL SHOCK WAVE LITHOTRIPSY (ESWL);  Surgeon: Malen Gauze, MD;  Location: [facility];  Service: Urology;  Laterality: Left;   MASS EXCISION Left 08/28/2014   Procedure: EXCISION  MUCOID CYST DEBRIDEMENT DISTAL INTERPHALANGEAL JOINT LEFT MIDDLE FINGER;  Surgeon: Cindee Salt, MD;  Location: [facility];  Service: Orthopedics;  Laterality: Left;   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   ROTATOR CUFF REPAIR     bilateral-MCH   Family History  Problem Relation Age of Onset   Heart attack Father    Social History   Socioeconomic History   Marital  status: Married    Spouse name: Not on file   Number of children: Not on file   Years of education: Not on file   Highest education level: Not on file  Occupational History   Not on file  Tobacco Use   Smoking status: Never   Smokeless tobacco: Never  Substance and Sexual Activity   Alcohol use: No   Drug use: No   Sexual activity: Yes    Birth control/protection: None  Other Topics Concern   Not on file  Social History Narrative   Not on file   Social Drivers of Health   Financial Resource Strain: Not on file  Food Insecurity: Not on file  Transportation Needs: Not on file  Physical Activity: Not on file  Stress: Not on file  Social Connections: Not on file  Intimate Partner Violence: Not on file    SUBJECTIVE  Review of Systems Constitutional: Patient denies any unintentional weight loss or change in strength lntegumentary: Patient denies any rashes or pruritus Cardiovascular: Patient denies chest pain or syncope Respiratory: Patient denies shortness of breath Gastrointestinal: Patient ***denies nausea, vomiting, constipation, or diarrhea Musculoskeletal: Patient denies muscle cramps or weakness Neurologic: Patient denies convulsions or seizures Allergic/Immunologic: Patient denies recent allergic reaction(s) Hematologic/Lymphatic: Patient denies bleeding tendencies Endocrine: Patient denies heat/cold intolerance  GU: As per HPI.  OBJECTIVE There were no vitals filed for this visit. There is no height or weight on file to calculate BMI.  Physical Examination Constitutional: No obvious distress; patient is non-toxic appearing  Cardiovascular: No visible lower extremity edema.  Respiratory: The patient does not have audible wheezing/stridor; respirations do not appear labored  Gastrointestinal: Abdomen non-distended Musculoskeletal: Normal ROM of UEs  Skin: No obvious rashes/open sores  Neurologic: CN 2-12 grossly intact Psychiatric: Answered questions  appropriately with normal affect  Hematologic/Lymphatic/Immunologic: No obvious bruises or sites of spontaneous bleeding  Urine microscopy: ***negative *** WBC/hpf, *** RBC/hpf, *** bacteria UA: ***negative *** WBC/hpf, *** RBC/hpf, *** bacteria ***with no evidence of UTI ***with no evidence of microscopic hematuria ***otherwise unremarkable  PVR: *** ml  ASSESSMENT No diagnosis found.  We reviewed the operative procedures and findings. He is doing ***well. Pre-operative symptoms are *** since the procedure. We reviewed recent imaging results; ***awaiting radiology results, appears to have ***no acute findings.  ***For stone prevention: Advised adequate hydration and we discussed option to consider low oxalate diet given that calcium oxalate is the most common type of stone. Handout provided about stone prevention diet.  ***For recurrent stone formers: We discussed option to proceed with 24 hour urinalysis ([facility]) for metabolic evaluation, which may help with targeted recommendations for dietary I medication therapies for stone prevention. Patient elected to ***proceed/ ***hold off.  Will plan to follow up in ***6 months / ***1 year with KUB ***RUS for stone surveillance or sooner if needed.  Pt verbalized understanding and agreement. All questions were answered.  PLAN Advised the following: ***Maintain adequate fluid intake. ***Low oxalate diet. No follow-ups on file.  *** Billing: 90 day global period for ESWL ***  No orders  of the defined types were placed in this encounter.   It has been explained that the patient is to follow regularly with their PCP in addition to all other providers involved in their care and to follow instructions provided by these respective offices. Patient advised to contact urology clinic if any urologic-pertaining questions, concerns, new symptoms or problems arise in the interim period.  There are no Patient Instructions on file for this  visit.  Electronically signed by:  Donnita Falls, MSN, FNP-C, CUNP 12/11/2023 12:41 PM

## 2023-12-11 NOTE — Progress Notes (Signed)
Patient present in office today complaining of left flank pain over the night.  Patient unable to void stating that he used th bathroom before he came. Patient denies difficulty voiding or dysuria.  post void residual= 0 Per NP patient is to continue flomax and keep scheduled f/u with her.  Patient voiced understanding.

## 2023-12-12 ENCOUNTER — Ambulatory Visit: Payer: Medicare Other | Admitting: Urology

## 2023-12-25 ENCOUNTER — Other Ambulatory Visit: Payer: Self-pay | Admitting: Urology

## 2023-12-25 DIAGNOSIS — N200 Calculus of kidney: Secondary | ICD-10-CM

## 2023-12-25 DIAGNOSIS — Z09 Encounter for follow-up examination after completed treatment for conditions other than malignant neoplasm: Secondary | ICD-10-CM

## 2023-12-26 NOTE — H&P (View-Only) (Signed)
 Name: Russell Bryant DOB: 03-Jul-1941 MRN: [id number]  Diagnoses: 1) Post-operative state  HPI: Russell Bryant presents post-operatively.  GU history includes: 1. BPH with LUTS (weak stream). - Previously took Proscar  5 mg daily. - Prostate size = 5.8 x 4.7 cm per CT on 08/24/2023. 2. Elevated PSA.  - No prior PSA results found per chart review today. - Patient states he had a negative prostate biopsy by Dr. Chales. 3. Kidney stone(s).  Recent history: > 11/07/2023: Underwent left ESWL procedure by Dr. Sherrilee for management of a left distal ureteral stone.   > 11/21/2023: Asymptomatic however left ureteral stone still present on KUB. PVR = 80 ml.  > 12/05/2023: Underwent repeat left ESWL procedure on by Dr. Sherrilee for management of remaining left ureteral stone fragments.  > 12/11/2023: - KUB showed a persistent radiopaque density projecting over the expected area of the LEFT distal ureter measuring 7 mm. This is similar comparison to prior. - Nurse visit for PVR check (0 ml).  Postop course: KUB today: Awaiting radiology read; left distal ureteral stone appears to have fractured and progressed further but is still mostly present per urology provider read.   Today He reports feeling well and brought a stone fragment. He denies flank pain or abdominal pain. He denies fevers, nausea, or vomiting.  He denies increased urinary urgency, frequency, nocturia, dysuria, gross hematuria, hesitancy, straining to void, or sensations of incomplete emptying.   Fall Screening: Do you usually have a device to assist in your mobility? No   Medications: Current Outpatient Medications  Medication Sig Dispense Refill   ALPRAZolam  (XANAX ) 1 MG tablet Take 1 mg by mouth 2 (two) times daily.     amLODipine  (NORVASC ) 10 MG tablet Take 1 tablet (10 mg total) by mouth daily. RESTART AMLODIPINE  IN 3 DAYS. (Patient taking differently: Take 10 mg by mouth daily.)     aspirin  EC 81 MG  tablet Take 81 mg by mouth daily.       benazepril  (LOTENSIN ) 20 MG tablet Take 20 mg by mouth daily.     HYDROcodone -acetaminophen  (NORCO) 5-325 MG tablet Take 1 tablet by mouth every 6 (six) hours as needed for moderate pain (pain score 4-6). 30 tablet 0   levothyroxine  (SYNTHROID ) 50 MCG tablet Take 50 mcg by mouth daily.     ondansetron  (ZOFRAN ) 4 MG tablet Take 1 tablet (4 mg total) by mouth daily as needed for nausea or vomiting. 30 tablet 1   tamsulosin  (FLOMAX ) 0.4 MG CAPS capsule Take 1 capsule (0.4 mg total) by mouth daily. 30 capsule 2   No current facility-administered medications for this visit.    Allergies: No Known Allergies  Past Medical History:  Diagnosis Date   Arthritis    Diverticulosis 2014   pancolonic per colonoscopy   H/O hiatal hernia    Hernia    Hypercholesteremia    Hypertension    Pancreatitis    Past Surgical History:  Procedure Laterality Date   CATARACT EXTRACTION W/PHACO  11/05/2012   Procedure: CATARACT EXTRACTION PHACO AND INTRAOCULAR LENS PLACEMENT (IOC);  Surgeon: Cherene Mania, MD;  Location: [facility];  Service: Ophthalmology;  Laterality: Left;  CDE 18.11   CATARACT EXTRACTION W/PHACO  11/19/2012   Procedure: CATARACT EXTRACTION PHACO AND INTRAOCULAR LENS PLACEMENT (IOC);  Surgeon: Cherene Mania, MD;  Location: [facility];  Service: Ophthalmology;  Laterality: Right;  CDE:  19.10   COLONOSCOPY N/A 09/04/2013   MFM:rnonwpr diverticulosis   EXTRACORPOREAL SHOCK WAVE LITHOTRIPSY Left 11/07/2023  Procedure: EXTRACORPOREAL SHOCK WAVE LITHOTRIPSY (ESWL);  Surgeon: Sherrilee Belvie CROME, MD;  Location: [facility];  Service: Urology;  Laterality: Left;   EXTRACORPOREAL SHOCK WAVE LITHOTRIPSY Left 12/05/2023   Procedure: EXTRACORPOREAL SHOCK WAVE LITHOTRIPSY (ESWL);  Surgeon: Sherrilee Belvie CROME, MD;  Location: [facility];  Service: Urology;  Laterality: Left;   MASS EXCISION Left 08/28/2014   Procedure: 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 LEFT  MIDDLE FINGER;  Surgeon: Arley Curia, MD;  Location: [facility];  Service: Orthopedics;  Laterality: Left;   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   ROTATOR CUFF REPAIR     bilateral-MCH   Family History  Problem Relation Age of Onset   Heart attack Father    Social History   Socioeconomic History   Marital status: Married    Spouse name: Not on file   Number of children: Not on file   Years of education: Not on file   Highest education level: Not on file  Occupational History   Not on file  Tobacco Use   Smoking status: Never   Smokeless tobacco: Never  Substance and Sexual Activity   Alcohol use: No   Drug use: No   Sexual activity: Yes    Birth control/protection: None  Other Topics Concern   Not on file  Social History Narrative   Not on file   Social Drivers of Health   Financial Resource Strain: Not on file  Food Insecurity: Not on file  Transportation Needs: Not on file  Physical Activity: Not on file  Stress: Not on file  Social Connections: Not on file  Intimate Partner Violence: Not on file    SUBJECTIVE  Review of Systems Constitutional: Patient denies any unintentional weight loss or change in strength lntegumentary: Patient denies any rashes or pruritus Cardiovascular: Patient denies chest pain or syncope Respiratory: Patient denies shortness of breath Gastrointestinal: Patient denies nausea, vomiting, constipation, or diarrhea Musculoskeletal: Patient denies muscle cramps or weakness Neurologic: Patient denies convulsions or seizures Allergic/Immunologic: Patient denies recent allergic reaction(s) Hematologic/Lymphatic: Patient denies bleeding tendencies Endocrine: Patient denies heat/cold intolerance  GU: As per HPI.  OBJECTIVE Vitals:   01/02/24 1357  BP: 137/72  Pulse: 80  Temp: 98.1 F (36.7 C)   There is no height or weight on file to calculate BMI.  Physical Examination Constitutional: No obvious distress; patient  is non-toxic appearing  Cardiovascular: No visible lower extremity edema.  Respiratory: The patient does not have audible wheezing/stridor; respirations do not appear labored  Gastrointestinal: Abdomen non-distended Musculoskeletal: Normal ROM of UEs  Skin: No obvious rashes/open sores  Neurologic: CN 2-12 grossly intact Psychiatric: Answered questions appropriately with normal affect  Hematologic/Lymphatic/Immunologic: No obvious bruises or sites of spontaneous bleeding  Urine microscopy: 3-10 RBC/hpf, otherwise unremarkable  ASSESSMENT Left ureteral stone - Plan: Calculi, with Photograph  Kidney stones - Plan: Urinalysis, Routine w reflex microscopic, BLADDER SCAN AMB NON-IMAGING, Calculi, with Photograph  Postop check - Plan: Urinalysis, Routine w reflex microscopic, BLADDER SCAN AMB NON-IMAGING  Benign prostatic hyperplasia, unspecified whether lower urinary tract symptoms present  We reviewed the operative procedures and findings. Will consult with Dr. Sherrilee to determine next steps for stone management, which patient was advised may include another round of ESWL or progression to ureteroscopic stone manipulation. We discussed both procedures including potential risks and benefits. Will notify patient of recommendations and plan to proceed accordingly. In the meantime He was advised to go to the ER if He develops fever >100.5  F, uncontrollable pain, or other significantly concerning symptoms. Pt verbalized understanding and agreement. All questions were answered.  PLAN Advised the following: Stone analysis. Return for follow up to be determined per MD recommendation.   Orders Placed This Encounter  Procedures   Urinalysis, Routine w reflex microscopic   Calculi, with Photograph   BLADDER SCAN AMB NON-IMAGING    It has been explained that the patient is to follow regularly with their PCP in addition to all other providers involved in their care and to follow instructions  provided by these respective offices. Patient advised to contact urology clinic if any urologic-pertaining questions, concerns, new symptoms or problems arise in the interim period.  There are no Patient Instructions on file for this visit.  Electronically signed by:  Lauraine KYM Oz, MSN, FNP-C, CUNP 01/02/2024 2:22 PM

## 2023-12-26 NOTE — Progress Notes (Signed)
 Name: Russell Bryant DOB: 03-Jul-1941 MRN: [id number]  Diagnoses: 1) Post-operative state  HPI: Russell Bryant presents post-operatively.  GU history includes: 1. BPH with LUTS (weak stream). - Previously took Proscar  5 mg daily. - Prostate size = 5.8 x 4.7 cm per CT on 08/24/2023. 2. Elevated PSA.  - No prior PSA results found per chart review today. - Patient states he had a negative prostate biopsy by Dr. Chales. 3. Kidney stone(s).  Recent history: > 11/07/2023: Underwent left ESWL procedure by Dr. Sherrilee for management of a left distal ureteral stone.   > 11/21/2023: Asymptomatic however left ureteral stone still present on KUB. PVR = 80 ml.  > 12/05/2023: Underwent repeat left ESWL procedure on by Dr. Sherrilee for management of remaining left ureteral stone fragments.  > 12/11/2023: - KUB showed a persistent radiopaque density projecting over the expected area of the LEFT distal ureter measuring 7 mm. This is similar comparison to prior. - Nurse visit for PVR check (0 ml).  Postop course: KUB today: Awaiting radiology read; left distal ureteral stone appears to have fractured and progressed further but is still mostly present per urology provider read.   Today He reports feeling well and brought a stone fragment. He denies flank pain or abdominal pain. He denies fevers, nausea, or vomiting.  He denies increased urinary urgency, frequency, nocturia, dysuria, gross hematuria, hesitancy, straining to void, or sensations of incomplete emptying.   Fall Screening: Do you usually have a device to assist in your mobility? No   Medications: Current Outpatient Medications  Medication Sig Dispense Refill   ALPRAZolam  (XANAX ) 1 MG tablet Take 1 mg by mouth 2 (two) times daily.     amLODipine  (NORVASC ) 10 MG tablet Take 1 tablet (10 mg total) by mouth daily. RESTART AMLODIPINE  IN 3 DAYS. (Patient taking differently: Take 10 mg by mouth daily.)     aspirin  EC 81 MG  tablet Take 81 mg by mouth daily.       benazepril  (LOTENSIN ) 20 MG tablet Take 20 mg by mouth daily.     HYDROcodone -acetaminophen  (NORCO) 5-325 MG tablet Take 1 tablet by mouth every 6 (six) hours as needed for moderate pain (pain score 4-6). 30 tablet 0   levothyroxine  (SYNTHROID ) 50 MCG tablet Take 50 mcg by mouth daily.     ondansetron  (ZOFRAN ) 4 MG tablet Take 1 tablet (4 mg total) by mouth daily as needed for nausea or vomiting. 30 tablet 1   tamsulosin  (FLOMAX ) 0.4 MG CAPS capsule Take 1 capsule (0.4 mg total) by mouth daily. 30 capsule 2   No current facility-administered medications for this visit.    Allergies: No Known Allergies  Past Medical History:  Diagnosis Date   Arthritis    Diverticulosis 2014   pancolonic per colonoscopy   H/O hiatal hernia    Hernia    Hypercholesteremia    Hypertension    Pancreatitis    Past Surgical History:  Procedure Laterality Date   CATARACT EXTRACTION W/PHACO  11/05/2012   Procedure: CATARACT EXTRACTION PHACO AND INTRAOCULAR LENS PLACEMENT (IOC);  Surgeon: Cherene Mania, MD;  Location: [facility];  Service: Ophthalmology;  Laterality: Left;  CDE 18.11   CATARACT EXTRACTION W/PHACO  11/19/2012   Procedure: CATARACT EXTRACTION PHACO AND INTRAOCULAR LENS PLACEMENT (IOC);  Surgeon: Cherene Mania, MD;  Location: [facility];  Service: Ophthalmology;  Laterality: Right;  CDE:  19.10   COLONOSCOPY N/A 09/04/2013   MFM:[id number] diverticulosis   EXTRACORPOREAL SHOCK WAVE LITHOTRIPSY Left 11/07/2023  Procedure: EXTRACORPOREAL SHOCK WAVE LITHOTRIPSY (ESWL);  Surgeon: Sherrilee Belvie CROME, MD;  Location: [facility];  Service: Urology;  Laterality: Left;   EXTRACORPOREAL SHOCK WAVE LITHOTRIPSY Left 12/05/2023   Procedure: EXTRACORPOREAL SHOCK WAVE LITHOTRIPSY (ESWL);  Surgeon: Sherrilee Belvie CROME, MD;  Location: [facility];  Service: Urology;  Laterality: Left;   MASS EXCISION Left 08/28/2014   Procedure: 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 LEFT  MIDDLE FINGER;  Surgeon: Arley Curia, MD;  Location: [facility];  Service: Orthopedics;  Laterality: Left;   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   ROTATOR CUFF REPAIR     bilateral-MCH   Family History  Problem Relation Age of Onset   Heart attack Father    Social History   Socioeconomic History   Marital status: Married    Spouse name: Not on file   Number of children: Not on file   Years of education: Not on file   Highest education level: Not on file  Occupational History   Not on file  Tobacco Use   Smoking status: Never   Smokeless tobacco: Never  Substance and Sexual Activity   Alcohol use: No   Drug use: No   Sexual activity: Yes    Birth control/protection: None  Other Topics Concern   Not on file  Social History Narrative   Not on file   Social Drivers of Health   Financial Resource Strain: Not on file  Food Insecurity: Not on file  Transportation Needs: Not on file  Physical Activity: Not on file  Stress: Not on file  Social Connections: Not on file  Intimate Partner Violence: Not on file    SUBJECTIVE  Review of Systems Constitutional: Patient denies any unintentional weight loss or change in strength lntegumentary: Patient denies any rashes or pruritus Cardiovascular: Patient denies chest pain or syncope Respiratory: Patient denies shortness of breath Gastrointestinal: Patient denies nausea, vomiting, constipation, or diarrhea Musculoskeletal: Patient denies muscle cramps or weakness Neurologic: Patient denies convulsions or seizures Allergic/Immunologic: Patient denies recent allergic reaction(s) Hematologic/Lymphatic: Patient denies bleeding tendencies Endocrine: Patient denies heat/cold intolerance  GU: As per HPI.  OBJECTIVE Vitals:   01/02/24 1357  BP: 137/72  Pulse: 80  Temp: 98.1 F (36.7 C)   There is no height or weight on file to calculate BMI.  Physical Examination Constitutional: No obvious distress; patient  is non-toxic appearing  Cardiovascular: No visible lower extremity edema.  Respiratory: The patient does not have audible wheezing/stridor; respirations do not appear labored  Gastrointestinal: Abdomen non-distended Musculoskeletal: Normal ROM of UEs  Skin: No obvious rashes/open sores  Neurologic: CN 2-12 grossly intact Psychiatric: Answered questions appropriately with normal affect  Hematologic/Lymphatic/Immunologic: No obvious bruises or sites of spontaneous bleeding  Urine microscopy: 3-10 RBC/hpf, otherwise unremarkable  ASSESSMENT Left ureteral stone - Plan: Calculi, with Photograph  Kidney stones - Plan: Urinalysis, Routine w reflex microscopic, BLADDER SCAN AMB NON-IMAGING, Calculi, with Photograph  Postop check - Plan: Urinalysis, Routine w reflex microscopic, BLADDER SCAN AMB NON-IMAGING  Benign prostatic hyperplasia, unspecified whether lower urinary tract symptoms present  We reviewed the operative procedures and findings. Will consult with Dr. Sherrilee to determine next steps for stone management, which patient was advised may include another round of ESWL or progression to ureteroscopic stone manipulation. We discussed both procedures including potential risks and benefits. Will notify patient of recommendations and plan to proceed accordingly. In the meantime He was advised to go to the ER if He develops fever >100.5  F, uncontrollable pain, or other significantly concerning symptoms. Pt verbalized understanding and agreement. All questions were answered.  PLAN Advised the following: Stone analysis. Return for follow up to be determined per MD recommendation.   Orders Placed This Encounter  Procedures   Urinalysis, Routine w reflex microscopic   Calculi, with Photograph   BLADDER SCAN AMB NON-IMAGING    It has been explained that the patient is to follow regularly with their PCP in addition to all other providers involved in their care and to follow instructions  provided by these respective offices. Patient advised to contact urology clinic if any urologic-pertaining questions, concerns, new symptoms or problems arise in the interim period.  There are no Patient Instructions on file for this visit.  Electronically signed by:  Lauraine KYM Oz, MSN, FNP-C, CUNP 01/02/2024 2:22 PM

## 2024-01-01 ENCOUNTER — Ambulatory Visit (HOSPITAL_COMMUNITY)
Admission: RE | Admit: 2024-01-01 | Discharge: 2024-01-01 | Disposition: A | Discharge status: Home / Self Care | Payer: Medicare Other | Source: Ambulatory Visit | Attending: Urology | Admitting: Urology

## 2024-01-01 DIAGNOSIS — Z09 Encounter for follow-up examination after completed treatment for conditions other than malignant neoplasm: Secondary | ICD-10-CM | POA: Insufficient documentation

## 2024-01-01 DIAGNOSIS — N200 Calculus of kidney: Secondary | ICD-10-CM | POA: Insufficient documentation

## 2024-01-02 ENCOUNTER — Encounter: Payer: Self-pay | Admitting: Urology

## 2024-01-02 ENCOUNTER — Ambulatory Visit (INDEPENDENT_AMBULATORY_CARE_PROVIDER_SITE_OTHER): Payer: Medicare Other | Admitting: Urology

## 2024-01-02 VITALS — BP 137/72 | HR 80 | Temp 98.1°F

## 2024-01-02 DIAGNOSIS — N200 Calculus of kidney: Secondary | ICD-10-CM

## 2024-01-02 DIAGNOSIS — N400 Enlarged prostate without lower urinary tract symptoms: Secondary | ICD-10-CM

## 2024-01-02 DIAGNOSIS — N201 Calculus of ureter: Secondary | ICD-10-CM | POA: Diagnosis not present

## 2024-01-02 DIAGNOSIS — Z09 Encounter for follow-up examination after completed treatment for conditions other than malignant neoplasm: Secondary | ICD-10-CM | POA: Diagnosis not present

## 2024-01-02 LAB — URINALYSIS, ROUTINE W REFLEX MICROSCOPIC
Bilirubin, UA: NEGATIVE
Glucose, UA: NEGATIVE
Ketones, UA: NEGATIVE
Leukocytes,UA: NEGATIVE
Nitrite, UA: NEGATIVE
Protein,UA: NEGATIVE
Specific Gravity, UA: 1.03 (ref 1.005–1.030)
Urobilinogen, Ur: 0.2 mg/dL (ref 0.2–1.0)
pH, UA: 5.5 (ref 5.0–7.5)

## 2024-01-02 LAB — MICROSCOPIC EXAMINATION: Bacteria, UA: NONE SEEN

## 2024-01-02 NOTE — Progress Notes (Signed)
 post void residual=0 ?

## 2024-01-04 ENCOUNTER — Other Ambulatory Visit: Payer: Self-pay | Admitting: Urology

## 2024-01-04 ENCOUNTER — Telehealth: Payer: Self-pay

## 2024-01-04 DIAGNOSIS — N201 Calculus of ureter: Secondary | ICD-10-CM

## 2024-01-04 DIAGNOSIS — N200 Calculus of kidney: Secondary | ICD-10-CM

## 2024-01-04 NOTE — Telephone Encounter (Signed)
-----   Message from Lauraine JAYSON Oz sent at 01/04/2024  8:15 AM EST ----- Please let pt know that as discussed at LOV I have consulted with Dr. Sherrilee, who agreed with doing repeat ESWL. Surgery request submitted. Thanks. ----- Message ----- From: Oz Lauraine JAYSON, FNP Sent: 01/02/2024   2:20 PM EST To: Lauraine JAYSON Oz, FNP  Sent to Dr. Sherrilee on 01/01/23: Mr. Potvin was seen for postop today. Base on my review of today's KUB his left distal ureteral stone appears to have fractured and progressed further but is still mostly present despite ESWL x2 on 11/07/23 & 12/05/23. He is currently asymptomatic. Do you recommend another round of ESWL or progression to URS at this time? Please advise.

## 2024-01-04 NOTE — Telephone Encounter (Signed)
 Called to relay message from NP. Pt just asked that he be called a week in advance to make sure he has time to find care for his wife.

## 2024-01-05 ENCOUNTER — Other Ambulatory Visit: Payer: Self-pay

## 2024-01-05 ENCOUNTER — Encounter (HOSPITAL_BASED_OUTPATIENT_CLINIC_OR_DEPARTMENT_OTHER): Payer: Self-pay | Admitting: Urology

## 2024-01-05 DIAGNOSIS — N201 Calculus of ureter: Secondary | ICD-10-CM

## 2024-01-05 NOTE — Progress Notes (Signed)
 Pre Procedure / [facility] Phone Call Spoke with client personally, DOB, Address, Dr Name and Procedure verified Discussed NPO status for Monday Night 2400hrs for Tues am procedure on Jan 14th Denies any recent exposure to flu / covid Non-smoker Denies OSA/ CPAP use Reviewed medications with client Discussed wearing appropriate attire for day of procedure Client stated his son and/or daughter will be coming with patient day of procedure Informed of use of Laxative on Monday for Tue procedure Opportunity for questions provided

## 2024-01-06 LAB — CALCULI, WITH PHOTOGRAPH (CLINICAL LAB)
Calcium Oxalate Monohydrate: 100 %
Weight Calculi: 55 mg

## 2024-01-08 ENCOUNTER — Telehealth: Payer: Self-pay

## 2024-01-08 NOTE — Telephone Encounter (Signed)
 Patient was made aware and voiced understanding.

## 2024-01-08 NOTE — Telephone Encounter (Signed)
-----   Message from Donnita Falls sent at 01/08/2024  9:24 AM EST ----- Please let pt know stone analysis showed 100% calcium oxalate composition. Please advise adequate fluid intake (around 2 liters per day) and low oxalate diet for stone prevention.

## 2024-01-09 ENCOUNTER — Encounter (HOSPITAL_BASED_OUTPATIENT_CLINIC_OR_DEPARTMENT_OTHER)
Admission: RE | Disposition: A | Discharge status: Home / Self Care | Payer: Self-pay | Source: Home / Self Care | Attending: Urology

## 2024-01-09 ENCOUNTER — Encounter (HOSPITAL_BASED_OUTPATIENT_CLINIC_OR_DEPARTMENT_OTHER): Payer: Self-pay | Admitting: Urology

## 2024-01-09 ENCOUNTER — Ambulatory Visit (HOSPITAL_COMMUNITY): Payer: Medicare Other

## 2024-01-09 ENCOUNTER — Other Ambulatory Visit: Payer: Self-pay

## 2024-01-09 ENCOUNTER — Ambulatory Visit (HOSPITAL_BASED_OUTPATIENT_CLINIC_OR_DEPARTMENT_OTHER)
Admission: RE | Admit: 2024-01-09 | Discharge: 2024-01-09 | Disposition: A | Discharge status: Home / Self Care | Payer: Medicare Other | Attending: Urology | Admitting: Urology

## 2024-01-09 DIAGNOSIS — N200 Calculus of kidney: Secondary | ICD-10-CM

## 2024-01-09 DIAGNOSIS — N201 Calculus of ureter: Secondary | ICD-10-CM | POA: Insufficient documentation

## 2024-01-09 DIAGNOSIS — I10 Essential (primary) hypertension: Secondary | ICD-10-CM | POA: Diagnosis not present

## 2024-01-09 DIAGNOSIS — N400 Enlarged prostate without lower urinary tract symptoms: Secondary | ICD-10-CM

## 2024-01-09 HISTORY — PX: EXTRACORPOREAL SHOCK WAVE LITHOTRIPSY: SHX1557

## 2024-01-09 SURGERY — EXTRACORPOREAL SHOCK WAVE LITHOTRIPSY (ESWL)
Anesthesia: LOCAL | Laterality: Left

## 2024-01-09 MED ORDER — TAMSULOSIN HCL 0.4 MG PO CAPS: 0.4000 mg | ORAL_CAPSULE | Freq: Every day | ORAL | 2 refills | Status: DC

## 2024-01-09 MED ORDER — SODIUM CHLORIDE 0.9 % IV SOLN: INTRAVENOUS | Status: DC

## 2024-01-09 MED ORDER — DIPHENHYDRAMINE HCL 25 MG PO CAPS
ORAL_CAPSULE | ORAL | Status: AC
  Filled 2024-01-09: qty 1

## 2024-01-09 MED ORDER — DIAZEPAM 5 MG PO TABS
ORAL_TABLET | ORAL | Status: AC
  Filled 2024-01-09: qty 1

## 2024-01-09 MED ORDER — DIAZEPAM 5 MG PO TABS
10.0000 mg | ORAL_TABLET | ORAL | Status: AC
  Administered 2024-01-09: 5 mg via ORAL

## 2024-01-09 MED ORDER — DIPHENHYDRAMINE HCL 25 MG PO CAPS
25.0000 mg | ORAL_CAPSULE | ORAL | Status: AC
  Administered 2024-01-09: 25 mg via ORAL

## 2024-01-09 MED ORDER — HYDROCODONE-ACETAMINOPHEN 5-325 MG PO TABS: 1.0000 | ORAL_TABLET | Freq: Four times a day (QID) | ORAL | 0 refills | Status: DC | PRN

## 2024-01-09 NOTE — Interval H&P Note (Signed)
 History and Physical Interval Note:  01/09/2024 9:00 AM  Russell Bryant  has presented today for surgery, with the diagnosis of Left Ureteral Stone.  The various methods of treatment have been discussed with the patient and family. After consideration of risks, benefits and other options for treatment, the patient has consented to  Procedure(s): EXTRACORPOREAL SHOCK WAVE LITHOTRIPSY (ESWL) (Left) as a surgical intervention.  The patient's history has been reviewed, patient examined, no change in status, stable for surgery.  I have reviewed the patient's chart and labs.  Questions were answered to the patient's satisfaction.     [name]

## 2024-01-10 ENCOUNTER — Encounter (HOSPITAL_BASED_OUTPATIENT_CLINIC_OR_DEPARTMENT_OTHER): Payer: Self-pay | Admitting: Urology

## 2024-01-17 NOTE — Progress Notes (Signed)
Name: Russell Bryant DOB: 19-Sep-1941 MRN: [id number]  Diagnoses: 1) Post-operative state  HPI: Russell Bryant presents post-operatively.   He underwent left ESWL procedure on 01/09/2024 by Dr. Ronne Binning for management of a left distal stone.  Postop course: KUB today: Awaiting radiology read; no stone material appreciated per urology provider interpretation.  Today He reports doing well with no flank pain or abdominal pain. Denies fevers, nausea, or vomiting. Denies increased urinary urgency, frequency, nocturia, dysuria, gross hematuria, hesitancy, straining to void, or sensations of incomplete emptying.   Fall Screening: Do you usually have a device to assist in your mobility? No   Medications: Current Outpatient Medications  Medication Sig Dispense Refill   ALPRAZolam (XANAX) 1 MG tablet Take 1 mg by mouth 2 (two) times daily.     amLODipine (NORVASC) 10 MG tablet Take 1 tablet (10 mg total) by mouth daily. RESTART AMLODIPINE IN 3 DAYS. (Patient taking differently: Take 10 mg by mouth daily.)     benazepril (LOTENSIN) 20 MG tablet Take 20 mg by mouth daily.     HYDROcodone-acetaminophen (NORCO) 5-325 MG tablet Take 1 tablet by mouth every 6 (six) hours as needed for moderate pain (pain score 4-6). 30 tablet 0   levothyroxine (SYNTHROID) 50 MCG tablet Take 50 mcg by mouth daily.     ondansetron (ZOFRAN) 4 MG tablet Take 1 tablet (4 mg total) by mouth daily as needed for nausea or vomiting. 30 tablet 1   tamsulosin (FLOMAX) 0.4 MG CAPS capsule Take 1 capsule (0.4 mg total) by mouth daily. 30 capsule 2   No current facility-administered medications for this visit.    Allergies: No Known Allergies  Past Medical History:  Diagnosis Date   Arthritis    Diverticulosis 2014   pancolonic per colonoscopy   H/O hiatal hernia    Hernia    Hypercholesteremia    Hypertension    Pancreatitis    Past Surgical History:  Procedure Laterality Date   CATARACT EXTRACTION W/PHACO   11/05/2012   Procedure: CATARACT EXTRACTION PHACO AND INTRAOCULAR LENS PLACEMENT (IOC);  Surgeon: Gemma Payor, MD;  Location: [facility];  Service: Ophthalmology;  Laterality: Left;  CDE 18.11   CATARACT EXTRACTION W/PHACO  11/19/2012   Procedure: CATARACT EXTRACTION PHACO AND INTRAOCULAR LENS PLACEMENT (IOC);  Surgeon: Gemma Payor, MD;  Location: [facility];  Service: Ophthalmology;  Laterality: Right;  CDE:  19.10   COLONOSCOPY N/A 09/04/2013   [id number] diverticulosis   EXTRACORPOREAL SHOCK WAVE LITHOTRIPSY Left 11/07/2023   Procedure: EXTRACORPOREAL SHOCK WAVE LITHOTRIPSY (ESWL);  Surgeon: Malen Gauze, MD;  Location: [facility];  Service: Urology;  Laterality: Left;   EXTRACORPOREAL SHOCK WAVE LITHOTRIPSY Left 12/05/2023   Procedure: EXTRACORPOREAL SHOCK WAVE LITHOTRIPSY (ESWL);  Surgeon: Malen Gauze, MD;  Location: [facility];  Service: Urology;  Laterality: Left;   EXTRACORPOREAL SHOCK WAVE LITHOTRIPSY Left 01/09/2024   Procedure: EXTRACORPOREAL SHOCK WAVE LITHOTRIPSY (ESWL);  Surgeon: Malen Gauze, MD;  Location: [facility];  Service: Urology;  Laterality: Left;   MASS EXCISION Left 08/28/2014   Procedure: 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;  Surgeon: Cindee Salt, MD;  Location: [facility];  Service: Orthopedics;  Laterality: Left;   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   ROTATOR CUFF REPAIR     bilateral-MCH   Family History  Problem Relation Age of Onset   Heart attack Father    Social History   Socioeconomic History  Marital status: Married    Spouse name: Not on file   Number of children: Not on file   Years of education: Not on file   Highest education level: Not on file  Occupational History   Not on file  Tobacco Use   Smoking status: Never   Smokeless tobacco: Never  Substance and Sexual Activity   Alcohol use: No   Drug use: No   Sexual activity: Yes    Birth control/protection: None   Other Topics Concern   Not on file  Social History Narrative   Not on file   Social Drivers of Health   Financial Resource Strain: Not on file  Food Insecurity: Not on file  Transportation Needs: Not on file  Physical Activity: Not on file  Stress: Not on file  Social Connections: Not on file  Intimate Partner Violence: Not on file    SUBJECTIVE  Review of Systems Constitutional: Patient denies any unintentional weight loss or change in strength lntegumentary: Patient denies any rashes or pruritus Cardiovascular: Patient denies chest pain or syncope Respiratory: Patient denies shortness of breath Gastrointestinal: Patient denies nausea, vomiting, constipation, or diarrhea Musculoskeletal: Patient denies muscle cramps or weakness Neurologic: Patient denies convulsions or seizures Allergic/Immunologic: Patient denies recent allergic reaction(s) Hematologic/Lymphatic: Patient denies bleeding tendencies Endocrine: Patient denies heat/cold intolerance  GU: As per HPI.  OBJECTIVE Vitals:   01/26/24 1150  BP: (!) 147/79  Pulse: 71   There is no height or weight on file to calculate BMI.  Physical Examination Constitutional: No obvious distress; patient is non-toxic appearing  Cardiovascular: No visible lower extremity edema.  Respiratory: The patient does not have audible wheezing/stridor; respirations do not appear labored  Gastrointestinal: Abdomen non-distended Musculoskeletal: Normal ROM of UEs  Skin: No obvious rashes/open sores  Neurologic: CN 2-12 grossly intact Psychiatric: Answered questions appropriately with normal affect  Hematologic/Lymphatic/Immunologic: No obvious bruises or sites of spontaneous bleeding  UA: negative   ASSESSMENT Kidney stones - Plan: Urinalysis, Routine w reflex microscopic, Calculi, with Photograph (to Clinical Lab), DG Abd 1 View  Left ureteral stone - Plan: Urinalysis, Routine w reflex microscopic, Calculi, with Photograph (to  Clinical Lab)  Postop check - Plan: Urinalysis, Routine w reflex microscopic, Calculi, with Photograph (to Clinical Lab)  We reviewed the operative procedures and findings. He is doing well. We reviewed recent imaging results; awaiting radiology results, appears to have no acute findings.  Will plan to follow up in 6 months /with KUB for stone surveillance or sooner if needed. Patient verbalized understanding of and agreement with current plan. All questions were answered.  PLAN Advised the following: Return in about 6 months (around 07/25/2024) for KUB, UA, & f/u with Evette Georges NP.   Orders Placed This Encounter  Procedures   DG Abd 1 View    Standing Status:   Future    Expected Date:   07/25/2024    Expiration Date:   01/25/2025    Reason for Exam (SYMPTOM  OR DIAGNOSIS REQUIRED):   kidney stone    Preferred imaging location?:   [facility]   Urinalysis, Routine w reflex microscopic   Calculi, with Photograph (to Clinical Lab)    It has been explained that the patient is to follow regularly with their PCP in addition to all other providers involved in their care and to follow instructions provided by these respective offices. Patient advised to contact urology clinic if any urologic-pertaining questions, concerns, new symptoms or problems arise in the interim  period.  There are no Patient Instructions on file for this visit.  Electronically signed by:  Donnita Falls, MSN, FNP-C, CUNP 01/26/2024 1:08 PM

## 2024-01-25 ENCOUNTER — Ambulatory Visit (HOSPITAL_COMMUNITY)
Admission: RE | Admit: 2024-01-25 | Discharge: 2024-01-25 | Disposition: A | Discharge status: Home / Self Care | Payer: Medicare Other | Source: Ambulatory Visit | Attending: Urology | Admitting: Urology

## 2024-01-25 ENCOUNTER — Telehealth: Payer: Self-pay

## 2024-01-25 DIAGNOSIS — N201 Calculus of ureter: Secondary | ICD-10-CM | POA: Diagnosis not present

## 2024-01-25 NOTE — Telephone Encounter (Signed)
Spoke with patient and informed him that a follow up with [place] is not needed per Dr. Jena Gauss based on his MRI from 2016. Pt verbalized understanding.

## 2024-01-26 ENCOUNTER — Ambulatory Visit (INDEPENDENT_AMBULATORY_CARE_PROVIDER_SITE_OTHER): Payer: Medicare Other | Admitting: Urology

## 2024-01-26 ENCOUNTER — Encounter: Payer: Self-pay | Admitting: Urology

## 2024-01-26 VITALS — BP 147/79 | HR 71

## 2024-01-26 DIAGNOSIS — Z09 Encounter for follow-up examination after completed treatment for conditions other than malignant neoplasm: Secondary | ICD-10-CM

## 2024-01-26 DIAGNOSIS — N201 Calculus of ureter: Secondary | ICD-10-CM

## 2024-01-26 DIAGNOSIS — N200 Calculus of kidney: Secondary | ICD-10-CM | POA: Diagnosis not present

## 2024-01-26 DIAGNOSIS — Z87442 Personal history of urinary calculi: Secondary | ICD-10-CM

## 2024-01-26 LAB — URINALYSIS, ROUTINE W REFLEX MICROSCOPIC
Bilirubin, UA: NEGATIVE
Glucose, UA: NEGATIVE
Ketones, UA: NEGATIVE
Leukocytes,UA: NEGATIVE
Nitrite, UA: NEGATIVE
Protein,UA: NEGATIVE
RBC, UA: NEGATIVE
Specific Gravity, UA: <1.005 — ABNORMAL LOW (ref 1.005–1.030)
Urobilinogen, Ur: 0.2 mg/dL (ref 0.2–1.0)
pH, UA: 6 (ref 5.0–7.5)

## 2024-02-01 ENCOUNTER — Telehealth: Payer: Self-pay

## 2024-02-01 LAB — CALCULI, WITH PHOTOGRAPH (CLINICAL LAB)
Calcium Oxalate Monohydrate: 100 %
Weight Calculi: 144 mg

## 2024-02-01 NOTE — Telephone Encounter (Signed)
-----   Message from Lauretta Ponto sent at 02/01/2024  8:49 AM EST ----- Please let pt know stone analysis showed 100% calcium oxalate composition. Please advise adequate fluid intake (around 2 liters per day) and low oxalate diet for stone prevention.

## 2024-02-01 NOTE — Telephone Encounter (Signed)
 Patient was made aware and voiced understanding.

## 2024-04-25 DIAGNOSIS — E782 Mixed hyperlipidemia: Secondary | ICD-10-CM | POA: Diagnosis not present

## 2024-04-25 DIAGNOSIS — E039 Hypothyroidism, unspecified: Secondary | ICD-10-CM | POA: Diagnosis not present

## 2024-04-25 DIAGNOSIS — N200 Calculus of kidney: Secondary | ICD-10-CM | POA: Diagnosis not present

## 2024-04-25 DIAGNOSIS — I10 Essential (primary) hypertension: Secondary | ICD-10-CM | POA: Diagnosis not present

## 2024-04-25 DIAGNOSIS — F419 Anxiety disorder, unspecified: Secondary | ICD-10-CM | POA: Diagnosis not present

## 2024-04-25 DIAGNOSIS — Z6822 Body mass index (BMI) 22.0-22.9, adult: Secondary | ICD-10-CM | POA: Diagnosis not present

## 2024-06-20 ENCOUNTER — Ambulatory Visit (HOSPITAL_COMMUNITY)
Admission: RE | Admit: 2024-06-20 | Discharge: 2024-06-20 | Disposition: A | Discharge status: Home / Self Care | Source: Ambulatory Visit | Attending: Urology | Admitting: Urology

## 2024-06-20 DIAGNOSIS — N200 Calculus of kidney: Secondary | ICD-10-CM | POA: Insufficient documentation

## 2024-06-21 NOTE — Progress Notes (Unsigned)
 Name: Russell Bryant DOB: May 13, 1941 MRN: [id number]  History of Present Illness: Russell Bryant is a 83 y.o. male who presents today for follow up visit at [facility] [place]. Relevant History includes: 1. Kidney stones. - 01/02/2024 & 01/26/2024: [place]a analysis showed 100% calcium oxalate composition. - 01/09/2024: Left ESWL by Dr. Sherrilee.  At last visit on 01/26/2024: Doing well.   Since last visit: > KUB on 06/20/2024: 1. Nonobstructive bowel gas pattern. Moderate volume fecal loading throughout the colon, as can be seen in constipation. 2. While no radiopaque renal calculi are visualized, the renal fossa bilaterally are likely obscured by overlying colonic material.  Today: He {Actions; denies-reports:120008} recent suspected stone migration / passage. He {Actions; denies-reports:120008} flank pain or abdominal pain. He {Actions; denies-reports:120008} fevers, nausea, or vomiting.  He {Actions; denies-reports:120008} increased urinary urgency, frequency, nocturia, dysuria, gross hematuria, hesitancy, straining to void, or sensations of incomplete emptying.  Medications: Current Outpatient Medications  Medication Sig Dispense Refill   ALPRAZolam (XANAX) 1 MG tablet Take 1 mg by mouth 2 (two) times daily.     amLODipine  (NORVASC ) 10 MG tablet Take 1 tablet (10 mg total) by mouth daily. RESTART AMLODIPINE  IN 3 DAYS. (Patient taking differently: Take 10 mg by mouth daily.)     benazepril  (LOTENSIN ) 20 MG tablet Take 20 mg by mouth daily.     HYDROcodone -acetaminophen  (NORCO) 5-325 MG tablet Take 1 tablet by mouth every 6 (six) hours as needed for moderate pain (pain score 4-6). 30 tablet 0   levothyroxine (SYNTHROID) 50 MCG tablet Take 50 mcg by mouth daily.     ondansetron  (ZOFRAN ) 4 MG tablet Take 1 tablet (4 mg total) by mouth daily as needed for nausea or vomiting. 30 tablet 1   tamsulosin  (FLOMAX ) 0.4 MG CAPS capsule Take 1 capsule (0.4 mg total) by mouth daily. 30  capsule 2   No current facility-administered medications for this visit.    Allergies: No Known Allergies  Past Medical History:  Diagnosis Date   Arthritis    Diverticulosis 2014   pancolonic per colonoscopy   H/O hiatal hernia    Hernia    Hypercholesteremia    Hypertension    Pancreatitis    Past Surgical History:  Procedure Laterality Date   CATARACT EXTRACTION W/PHACO  11/05/2012   Procedure: CATARACT EXTRACTION PHACO AND INTRAOCULAR LENS PLACEMENT (IOC);  Surgeon: Russell Mania, MD;  Location: [facility];  Service: Ophthalmology;  Laterality: Left;  CDE 18.11   CATARACT EXTRACTION W/PHACO  11/19/2012   Procedure: CATARACT EXTRACTION PHACO AND INTRAOCULAR LENS PLACEMENT (IOC);  Surgeon: Russell Mania, MD;  Location: [facility];  Service: Ophthalmology;  Laterality: Right;  CDE:  19.10   COLONOSCOPY N/A 09/04/2013   MFM:rnonwpr diverticulosis   EXTRACORPOREAL SHOCK WAVE LITHOTRIPSY Left 11/07/2023   Procedure: EXTRACORPOREAL SHOCK WAVE LITHOTRIPSY (ESWL);  Surgeon: Sherrilee Belvie LITTIE, MD;  Location: [facility];  Service: Urology;  Laterality: Left;   EXTRACORPOREAL SHOCK WAVE LITHOTRIPSY Left 12/05/2023   Procedure: EXTRACORPOREAL SHOCK WAVE LITHOTRIPSY (ESWL);  Surgeon: Sherrilee Belvie LITTIE, MD;  Location: [facility];  Service: Urology;  Laterality: Left;   EXTRACORPOREAL SHOCK WAVE LITHOTRIPSY Left 01/09/2024   Procedure: EXTRACORPOREAL SHOCK WAVE LITHOTRIPSY (ESWL);  Surgeon: Sherrilee Belvie LITTIE, MD;  Location: [facility];  Service: Urology;  Laterality: Left;   MASS EXCISION Left 08/28/2014   Procedure: 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;  Surgeon: Russell Curia, MD;  Location: [facility];  Service:  Orthopedics;  Laterality: Left;   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   ROTATOR CUFF REPAIR     bilateral-MCH   Family History  Problem Relation Age of Onset   Heart attack Father    Social History   Socioeconomic History    Marital status: Married    Spouse name: Not on file   Number of children: Not on file   Years of education: Not on file   Highest education level: Not on file  Occupational History   Not on file  Tobacco Use   Smoking status: Never   Smokeless tobacco: Never  Substance and Sexual Activity   Alcohol use: No   Drug use: No   Sexual activity: Yes    Birth control/protection: None  Other Topics Concern   Not on file  Social History Narrative   Not on file   Social Drivers of Health   Financial Resource Strain: Not on file  Food Insecurity: Not on file  Transportation Needs: Not on file  Physical Activity: Not on file  Stress: Not on file  Social Connections: Not on file  Intimate Partner Violence: Not on file    SUBJECTIVE  Review of Systems Constitutional: Patient denies any unintentional weight loss or change in strength lntegumentary: Patient denies any rashes or pruritus Cardiovascular: Patient denies chest pain or syncope Respiratory: Patient denies shortness of breath Gastrointestinal: ***Patient denies nausea, vomiting, constipation, or diarrhea ***As per HPI Musculoskeletal: Patient denies muscle cramps or weakness Neurologic: Patient denies convulsions or seizures Allergic/Immunologic: Patient denies recent allergic reaction(s) Hematologic/Lymphatic: Patient denies bleeding tendencies Endocrine: Patient denies heat/cold intolerance  GU: As per HPI.  OBJECTIVE There were no vitals filed for this visit. There is no height or weight on file to calculate BMI.  Physical Examination Constitutional: No obvious distress; patient is non-toxic appearing  Cardiovascular: No visible lower extremity edema.  Respiratory: The patient does not have audible wheezing/stridor; respirations do not appear labored  Gastrointestinal: Abdomen non-distended Musculoskeletal: Normal ROM of UEs  Skin: No obvious rashes/open sores  Neurologic: CN 2-12 grossly intact Psychiatric:  Answered questions appropriately with normal affect  Hematologic/Lymphatic/Immunologic: No obvious bruises or sites of spontaneous bleeding  UA: ***negative ***positive for *** leukocytes, *** blood, ***nitrites Urine microscopy: *** WBC/hpf, *** RBC/hpf, *** bacteria ***glucosuria (secondary to ***Jardiance ***Farxiga use) ***otherwise unremarkable  PVR: *** ml  ASSESSMENT Benign prostatic hyperplasia, unspecified whether lower urinary tract symptoms present  Kidney stones  ***We reviewed recent imaging results; ***awaiting radiology results, appears to have ***no acute findings per provider interpretation.  ***For stone prevention: Advised adequate hydration and we discussed option to consider low oxalate diet given that calcium oxalate is the most common type of stone. Handout provided about stone prevention diet.  ***For recurrent stone formers: We discussed option to proceed with 24 hour urinalysis ([facility]) for metabolic stone evaluation, which may help with targeted recommendations for dietary I medication therapies for stone prevention. Patient elected to ***proceed/ ***hold off.  Will plan to follow up in ***6 months / ***1 year with ***KUB ***RUS for stone surveillance or sooner if needed.  Patient verbalized understanding of and agreement with current plan. All questions were answered.  PLAN Advised the following: Maintain adequate fluid intake daily. Drink citrus juice (lemon, lime or orange juice) routinely. Low oxalate diet. No follow-ups on file.  No orders of the defined types were placed in this encounter.   It has been explained that the patient is to follow regularly with their  PCP in addition to all other providers involved in their care and to follow instructions provided by these respective offices. Patient advised to contact urology clinic if any urologic-pertaining questions, concerns, new symptoms or problems arise in the interim period.  There are no  Patient Instructions on file for this visit.  Electronically signed by:  Lauraine KYM Oz, MSN, FNP-C, CUNP 06/21/2024 2:25 PM

## 2024-06-25 ENCOUNTER — Encounter: Payer: Self-pay | Admitting: Urology

## 2024-06-25 ENCOUNTER — Ambulatory Visit: Payer: Medicare Other | Admitting: Urology

## 2024-06-25 VITALS — BP 131/66 | HR 66

## 2024-06-25 DIAGNOSIS — N200 Calculus of kidney: Secondary | ICD-10-CM | POA: Diagnosis not present

## 2024-06-25 DIAGNOSIS — Z87442 Personal history of urinary calculi: Secondary | ICD-10-CM

## 2024-06-25 DIAGNOSIS — N400 Enlarged prostate without lower urinary tract symptoms: Secondary | ICD-10-CM | POA: Diagnosis not present

## 2024-06-25 LAB — URINALYSIS, ROUTINE W REFLEX MICROSCOPIC
Bilirubin, UA: NEGATIVE
Glucose, UA: NEGATIVE
Ketones, UA: NEGATIVE
Leukocytes,UA: NEGATIVE
Nitrite, UA: NEGATIVE
Protein,UA: NEGATIVE
RBC, UA: NEGATIVE
Specific Gravity, UA: <1.005 — ABNORMAL LOW (ref 1.005–1.030)
Urobilinogen, Ur: 0.2 mg/dL (ref 0.2–1.0)
pH, UA: 6 (ref 5.0–7.5)

## 2024-06-25 LAB — BLADDER SCAN AMB NON-IMAGING: Scan Result: 29

## 2024-07-29 ENCOUNTER — Ambulatory Visit (INDEPENDENT_AMBULATORY_CARE_PROVIDER_SITE_OTHER)

## 2024-07-29 VITALS — BP 137/62 | HR 67 | Ht 69.0 in | Wt 154.0 lb

## 2024-07-29 DIAGNOSIS — F419 Anxiety disorder, unspecified: Secondary | ICD-10-CM

## 2024-07-29 DIAGNOSIS — I10 Essential (primary) hypertension: Secondary | ICD-10-CM | POA: Diagnosis not present

## 2024-07-29 DIAGNOSIS — H6121 Impacted cerumen, right ear: Secondary | ICD-10-CM

## 2024-07-29 DIAGNOSIS — E039 Hypothyroidism, unspecified: Secondary | ICD-10-CM

## 2024-07-29 MED ORDER — AMLODIPINE BESYLATE 10 MG PO TABS: 10.0000 mg | ORAL_TABLET | Freq: Every day | ORAL | 1 refills | Status: AC

## 2024-07-29 MED ORDER — LEVOTHYROXINE SODIUM 50 MCG PO TABS
50.0000 ug | ORAL_TABLET | Freq: Every day | ORAL | 1 refills | Status: AC
Start: 2024-07-29 — End: ?

## 2024-07-29 MED ORDER — BENAZEPRIL HCL 20 MG PO TABS: 20.0000 mg | ORAL_TABLET | Freq: Every day | ORAL | 1 refills | Status: AC

## 2024-07-29 MED ORDER — ALPRAZOLAM 1 MG PO TABS: 1.0000 mg | ORAL_TABLET | Freq: Two times a day (BID) | ORAL | 0 refills | Status: DC

## 2024-07-29 NOTE — Progress Notes (Signed)
 New Patient Office Visit  Subjective    Patient ID: Russell Bryant, male    DOB: December 17, 1941  Age: 83 y.o. MRN: [id number]  CC:  Chief Complaint  Patient presents with   Establish Care    Establish care    HPI Russell Bryant presents to establish care Mr. Russell Bryant is a former patient of Dr. Marvine.  He is here today with his wife, who has dementia.  This came on suddenly over the past 4 years, and he has become her main caregiver.  She is 4 years younger than he is.  Mr. Russell Bryant states that he has a caregiver that comes in 6 hours a day with the care of his wife.  They also has adult children that live nearby.    Outpatient Encounter Medications as of 07/29/2024  Medication Sig   [DISCONTINUED] ALPRAZolam  (XANAX ) 1 MG tablet Take 1 mg by mouth 2 (two) times daily.   [DISCONTINUED] amLODipine  (NORVASC ) 10 MG tablet Take 1 tablet (10 mg total) by mouth daily. RESTART AMLODIPINE  IN 3 DAYS.   [DISCONTINUED] benazepril  (LOTENSIN ) 20 MG tablet Take 20 mg by mouth daily.   [DISCONTINUED] levothyroxine  (SYNTHROID ) 50 MCG tablet Take 50 mcg by mouth daily.   ALPRAZolam  (XANAX ) 1 MG tablet Take 1 tablet (1 mg total) by mouth 2 (two) times daily.   amLODipine  (NORVASC ) 10 MG tablet Take 1 tablet (10 mg total) by mouth daily.   benazepril  (LOTENSIN ) 20 MG tablet Take 1 tablet (20 mg total) by mouth daily.   levothyroxine  (SYNTHROID ) 50 MCG tablet Take 1 tablet (50 mcg total) by mouth daily.   [DISCONTINUED] HYDROcodone -acetaminophen  (NORCO) 5-325 MG tablet Take 1 tablet by mouth every 6 (six) hours as needed for moderate pain (pain score 4-6). (Patient not taking: Reported on 06/25/2024)   [DISCONTINUED] ondansetron  (ZOFRAN ) 4 MG tablet Take 1 tablet (4 mg total) by mouth daily as needed for nausea or vomiting. (Patient not taking: Reported on 07/29/2024)   [DISCONTINUED] tamsulosin  (FLOMAX ) 0.4 MG CAPS capsule Take 1 capsule (0.4 mg total) by mouth daily. (Patient not taking: Reported on 07/29/2024)    No facility-administered encounter medications on file as of 07/29/2024.    Past Medical History:  Diagnosis Date   Arthritis    Diverticulosis 2014   pancolonic per colonoscopy   H/O hiatal hernia    Hernia    Hypercholesteremia    Hypertension    Pancreatitis     Family History  Problem Relation Age of Onset   Heart attack Father    Social History   Socioeconomic History   Marital status: Married    Spouse name: Not on file   Number of children: Not on file   Years of education: Not on file   Highest education level: Not on file  Occupational History   Not on file  Tobacco Use   Smoking status: Never   Smokeless tobacco: Never  Substance and Sexual Activity   Alcohol use: No   Drug use: No   Sexual activity: Yes    Birth control/protection: None  Other Topics Concern   Not on file  Social History Narrative   Not on file   Social Drivers of Health   Financial Resource Strain: Not on file  Food Insecurity: Not on file  Transportation Needs: Not on file  Physical Activity: Not on file  Stress: Not on file  Social Connections: Not on file  Intimate Partner Violence: Not on file   ROS  Objective    BP 137/62   Pulse 67   Ht 5' 9 (1.753 m)   Wt 154 lb (69.9 kg)   SpO2 93%   BMI 22.74 kg/m   Physical Exam Vitals and nursing note reviewed.  Constitutional:      Appearance: Normal appearance.  HENT:     Head: Normocephalic.     Right Ear: Tympanic membrane normal.  Eyes:     Extraocular Movements: Extraocular movements intact.     Pupils: Pupils are equal, round, and reactive to light.  Cardiovascular:     Rate and Rhythm: Normal rate and regular rhythm.  Pulmonary:     Effort: Pulmonary effort is normal.     Breath sounds: Normal breath sounds.  Musculoskeletal:     Cervical back: Normal range of motion and neck supple.  Neurological:     Mental Status: He is alert and oriented to person, place, and time.  Psychiatric:        Mood  and Affect: Mood normal.        Thought Content: Thought content normal.         Assessment & Plan:   Problem List Items Addressed This Visit       Cardiovascular and Mediastinum   Essential hypertension - Primary   BP is well-controlled with amlodipine  and lisinopril.  No medication changes made at this time.  Refill sent to pharmacy.  Continue to practice low-sodium diet and maintain good oral hydration.      Relevant Medications   amLODipine  (NORVASC ) 10 MG tablet   benazepril  (LOTENSIN ) 20 MG tablet     Endocrine   Acquired hypothyroidism   Recent labs show stable thyroid  levels.  Continue with current dose of levothyroxine .  Will recheck labs at follow-up visit in 6 months.      Relevant Medications   levothyroxine  (SYNTHROID ) 50 MCG tablet     Other   Anxiety   Agreed to refill alprazolam  1 mg as needed for sleep or anxiety.  He reports feeling anxious when his wife is having a bad day.  He is obviously under more stress the past few years related to caring for his wife.      Relevant Medications   ALPRAZolam  (XANAX ) 1 MG tablet   Other Visit Diagnoses       Right ear impacted cerumen       Cerumen removed with irrigation.  Patient tolerated well, and reports improvement in his hearing.  TM well-visualized, no signs of infection.   Relevant Orders   Ear Lavage       Return in about 6 months (around 01/29/2025) for chronic follow-up with PCP.   Leita Longs, FNP

## 2024-08-08 DIAGNOSIS — F419 Anxiety disorder, unspecified: Secondary | ICD-10-CM | POA: Insufficient documentation

## 2024-08-08 DIAGNOSIS — E039 Hypothyroidism, unspecified: Secondary | ICD-10-CM | POA: Insufficient documentation

## 2024-08-08 NOTE — Assessment & Plan Note (Signed)
 Agreed to refill alprazolam  1 mg as needed for sleep or anxiety.  He reports feeling anxious when his wife is having a bad day.  He is obviously under more stress the past few years related to caring for his wife.

## 2024-08-08 NOTE — Assessment & Plan Note (Signed)
 Recent labs show stable thyroid  levels.  Continue with current dose of levothyroxine .  Will recheck labs at follow-up visit in 6 months.

## 2024-08-08 NOTE — Assessment & Plan Note (Addendum)
 BP is well-controlled with amlodipine  and lisinopril.  No medication changes made at this time.  Refill sent to pharmacy.  Continue to practice low-sodium diet and maintain good oral hydration.

## 2024-10-03 ENCOUNTER — Ambulatory Visit

## 2024-10-03 DIAGNOSIS — Z23 Encounter for immunization: Secondary | ICD-10-CM | POA: Diagnosis not present

## 2024-10-24 ENCOUNTER — Ambulatory Visit

## 2024-11-01 ENCOUNTER — Other Ambulatory Visit: Payer: Self-pay

## 2024-11-01 DIAGNOSIS — F419 Anxiety disorder, unspecified: Secondary | ICD-10-CM

## 2024-12-04 ENCOUNTER — Other Ambulatory Visit: Payer: Self-pay

## 2024-12-04 DIAGNOSIS — F419 Anxiety disorder, unspecified: Secondary | ICD-10-CM

## 2024-12-22 ENCOUNTER — Other Ambulatory Visit: Payer: Self-pay

## 2024-12-22 ENCOUNTER — Encounter (HOSPITAL_COMMUNITY): Payer: Self-pay

## 2024-12-22 ENCOUNTER — Emergency Department (HOSPITAL_COMMUNITY)
Admission: EM | Admit: 2024-12-22 | Discharge: 2024-12-22 | Disposition: A | Discharge status: Home / Self Care | Attending: Emergency Medicine | Admitting: Emergency Medicine

## 2024-12-22 ENCOUNTER — Emergency Department (HOSPITAL_COMMUNITY)

## 2024-12-22 DIAGNOSIS — I10 Essential (primary) hypertension: Secondary | ICD-10-CM | POA: Diagnosis not present

## 2024-12-22 DIAGNOSIS — Z79899 Other long term (current) drug therapy: Secondary | ICD-10-CM | POA: Insufficient documentation

## 2024-12-22 DIAGNOSIS — M545 Low back pain, unspecified: Secondary | ICD-10-CM | POA: Diagnosis present

## 2024-12-22 DIAGNOSIS — M5442 Lumbago with sciatica, left side: Secondary | ICD-10-CM | POA: Diagnosis not present

## 2024-12-22 MED ORDER — METHYLPREDNISOLONE 4 MG PO TBPK: ORAL_TABLET | ORAL | 0 refills | Status: AC

## 2024-12-22 MED ORDER — KETOROLAC TROMETHAMINE 15 MG/ML IJ SOLN
15.0000 mg | Freq: Once | INTRAMUSCULAR | Status: AC
  Administered 2024-12-22: 15 mg via INTRAMUSCULAR
  Filled 2024-12-22: qty 1

## 2024-12-22 MED ORDER — OXYCODONE-ACETAMINOPHEN 5-325 MG PO TABS: 0.5000 | ORAL_TABLET | Freq: Four times a day (QID) | ORAL | 0 refills | Status: AC | PRN

## 2024-12-22 NOTE — ED Triage Notes (Signed)
 Pt presents with acute on chronic back pain that got worse after he tried to help his wife in a wheelchair 2 weeks ago. Denies any fecal/urinary incontinence, or saddle anesthesia. Pt has been taking ibuprofen and APAP, and a half a muscle relaxer tablet with some improvement.

## 2024-12-22 NOTE — ED Provider Notes (Signed)
 " [place] EMERGENCY DEPARTMENT AT [facility] Provider Note   CSN: [id number] Arrival date & time: 12/22/24  1338     Patient presents with: Back Pain   Russell Bryant is a 83 y.o. male.    Back Pain .  Low back pain.  Has had around 2 weeks.  Began after trying to move his demented wife.  Had 2 episodes where he felt pain in his back going up.  In the low back going to the left leg.  Has had previous right-sided low back pain all that was around 30 years ago.  No numbness weakness. No loss of bladder bowel control.  Has been taking Motrin and Tylenol  and a muscle relaxer with some improvement.  No urinary symptoms.    Past Medical History:  Diagnosis Date   Arthritis    Diverticulosis 2014   pancolonic per colonoscopy   H/O hiatal hernia    Hernia    Hypercholesteremia    Hypertension    Pancreatitis     Prior to Admission medications  Medication Sig Start Date End Date Taking? Authorizing Provider  methylPREDNISolone  (MEDROL  DOSEPAK) 4 MG TBPK tablet Use per package directions 12/22/24  Yes Patsey Lot, MD  oxyCODONE -acetaminophen  (PERCOCET/ROXICET) 5-325 MG tablet Take 0.5-1 tablets by mouth every 6 (six) hours as needed for severe pain (pain score 7-10). 12/22/24  Yes Patsey Lot, MD  ALPRAZolam  (XANAX ) 1 MG tablet Take 1 tablet (1 mg total) by mouth 2 (two) times daily. 12/04/24   Bevely Doffing, FNP  amLODipine  (NORVASC ) 10 MG tablet Take 1 tablet (10 mg total) by mouth daily. 07/29/24   Bevely Doffing, FNP  benazepril  (LOTENSIN ) 20 MG tablet Take 1 tablet (20 mg total) by mouth daily. 07/29/24   Bevely Doffing, FNP  levothyroxine  (SYNTHROID ) 50 MCG tablet Take 1 tablet (50 mcg total) by mouth daily. 07/29/24   Bevely Doffing, FNP    Allergies: Patient has no known allergies.    Review of Systems  Musculoskeletal:  Positive for back pain.    Updated Vital Signs BP (!) 165/79   Pulse 64   Temp 97.8 F (36.6 C) (Oral)   Resp 17   Ht 5'  8 (1.727 m)   Wt 68 kg   SpO2 95%   BMI 22.81 kg/m   Physical Exam Vitals and nursing note reviewed.  Chest:     Chest wall: No tenderness.  Abdominal:     Tenderness: There is no abdominal tenderness.  Musculoskeletal:     Comments: Some tenderness over lumbar spine inferiorly.  Some mild pain with straight leg raise particularly on left.  Neurovascular intact distally.  Neurological:     Mental Status: He is alert.     (all labs ordered are listed, but only abnormal results are displayed) Labs Reviewed - No data to display  EKG: None  Radiology: CT Lumbar Spine Wo Contrast Result Date: 12/22/2024 EXAM: CT OF THE LUMBAR SPINE WITHOUT CONTRAST 12/22/2024 06:35:05 PM TECHNIQUE: CT of the lumbar spine was performed without the administration of intravenous contrast. Multiplanar reformatted images are provided for review. Automated exposure control, iterative reconstruction, and/or weight based adjustment of the mA/kV was utilized to reduce the radiation dose to as low as reasonably achievable. COMPARISON: None available. CLINICAL HISTORY: Low back pain, increased fracture risk. FINDINGS: BONES AND ALIGNMENT: Normal vertebral body heights. Moderate sigmoid scoliosis of the lumbar spine, apex right at L1 and apex left at L4. No acute fracture or listhesis. Endplate remodeling.  DEGENERATIVE CHANGES: Advanced degenerative disc disease is seen diffusely throughout the lumbar spine with disc space narrowing. T12-L1: Mild facet arthrosis. L1-L2: Severe disc space narrowing. Moderate right and severe left facet arthrosis. Moderate right and severe left neural foraminal narrowing. Mild central canal stenosis within the subcarinal zone. L2-L3: Eccentric left paracentral / foraminal / extraforaminal disc osteophyte likely abuts the exiting left L2 nerve root within the extraforaminal zone. Severe central canal stenosis within the subcarinal zone. Marked narrowing of the left lateral recess with  possible impingement of the crossing left L3 nerve root. L3-L4: Marked disc space narrowing. Severe bilateral facet arthrosis. Eccentric left foraminal / extraforaminal disc osteophyte may abut the exiting left L3 nerve root within the exit foraminal zone. Severe right neural foraminal narrowing with probable abutment of the exiting right L3 nerve root. Marked narrowing of the right lateral recess with probable impingement of the crossing right L4 nerve root. Moderate central canal stenosis within the subcarinal zone. L4-L5: Marked disc space narrowing. Severe bilateral facet arthrosis. Mild left and severe right neural foraminal narrowing with probable impingement of exiting right L4 nerve root. Severe central canal stenosis within the subcarinal zone. Marked narrowing of the lateral recess bilaterally with possible impingement of the crossing bilateral L5 nerve roots. L5-S1: Mild disc space narrowing. Mild right and moderate left neural foraminal narrowing. Severe bilateral facet arthrosis. Mild central canal stenosis within the subcarinal zone. SOFT TISSUES: No acute abnormality. IMPRESSION: 1. No acute fracture or listhesis. 2. Moderate sigmoid scoliosis of the lumbar spine, apex right at L1 and apex left at L4. 3. 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 4. 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 5. Multilevel facet arthrosis, severe at L1-L2 through L5-S1, with multilevel neural foraminal and lateral recess narrowing and probable nerve root impingement as described. Electronically signed by: Dorethia Molt MD 12/22/2024 07:26 PM EST RP Workstation: [id number]     Procedures   Medications Ordered in the ED  ketorolac  (TORADOL ) 15 MG/ML injection 15 mg (15 mg Intramuscular Given 12/22/24 2020)                                    Medical Decision Making Amount and/or  Complexity of Data Reviewed Radiology: ordered.  Risk Prescription drug management.   Patient has low back pain.  Worse on the left.  Somewhat traumatic and that he was lifting his wife and felt pain started.  No real red flag signs age.  Will get CT scan evaluate for fracture  CT scan shows no fracture, but does have arthritic changes.  I think likely causing the pain.  Will treat symptomatically with pain meds and steroids.  Follow-up with neurosurgery as needed.      Final diagnoses:  Acute low back pain with left-sided sciatica, unspecified back pain laterality    ED Discharge Orders          Ordered    methylPREDNISolone  (MEDROL  DOSEPAK) 4 MG TBPK tablet        12/22/24 2012    oxyCODONE -acetaminophen  (PERCOCET/ROXICET) 5-325 MG tablet  Every 6 hours PRN        12/22/24 2013               Patsey Lot, MD 12/22/24 2317  "

## 2025-01-06 ENCOUNTER — Other Ambulatory Visit: Payer: Self-pay

## 2025-01-06 DIAGNOSIS — F419 Anxiety disorder, unspecified: Secondary | ICD-10-CM

## 2025-01-28 ENCOUNTER — Telehealth: Payer: Self-pay

## 2025-01-28 ENCOUNTER — Other Ambulatory Visit: Payer: Self-pay

## 2025-01-28 DIAGNOSIS — N200 Calculus of kidney: Secondary | ICD-10-CM

## 2025-01-28 NOTE — Telephone Encounter (Signed)
 Patient will need a new kub order for this 1 year follow up in August.

## 2025-02-04 ENCOUNTER — Ambulatory Visit

## 2025-02-10 ENCOUNTER — Ambulatory Visit

## 2025-06-24 ENCOUNTER — Ambulatory Visit: Admitting: Urology

## 2025-08-08 ENCOUNTER — Ambulatory Visit: Admitting: Urology
